# Patient Record
Sex: Female | Born: 1945 | Race: White | Hispanic: No | Marital: Married | Smoking: Former smoker
Health system: Southern US, Community
[De-identification: ages and names within clinical notes are randomized; demographics above are authoritative.]

## PROBLEM LIST (undated history)

## (undated) DIAGNOSIS — E039 Hypothyroidism, unspecified: Secondary | ICD-10-CM

## (undated) DIAGNOSIS — M1990 Unspecified osteoarthritis, unspecified site: Secondary | ICD-10-CM

## (undated) DIAGNOSIS — E785 Hyperlipidemia, unspecified: Secondary | ICD-10-CM

## (undated) DIAGNOSIS — R519 Headache, unspecified: Secondary | ICD-10-CM

## (undated) DIAGNOSIS — R001 Bradycardia, unspecified: Secondary | ICD-10-CM

## (undated) DIAGNOSIS — T7840XA Allergy, unspecified, initial encounter: Secondary | ICD-10-CM

## (undated) DIAGNOSIS — R51 Headache: Secondary | ICD-10-CM

## (undated) DIAGNOSIS — H539 Unspecified visual disturbance: Secondary | ICD-10-CM

## (undated) HISTORY — DX: Allergy, unspecified, initial encounter: T78.40XA

## (undated) HISTORY — DX: Bradycardia, unspecified: R00.1

## (undated) HISTORY — DX: Hyperlipidemia, unspecified: E78.5

## (undated) HISTORY — DX: Hypothyroidism, unspecified: E03.9

## (undated) HISTORY — PX: HIP ARTHROPLASTY: SHX981

## (undated) HISTORY — DX: Headache: R51

## (undated) HISTORY — DX: Unspecified visual disturbance: H53.9

## (undated) HISTORY — PX: NECK LIFT: SHX6573

## (undated) HISTORY — PX: TUBAL LIGATION: SHX77

## (undated) HISTORY — PX: DENTAL SURGERY: SHX609

## (undated) HISTORY — DX: Unspecified osteoarthritis, unspecified site: M19.90

## (undated) HISTORY — DX: Headache, unspecified: R51.9

## (undated) HISTORY — PX: OTHER SURGICAL HISTORY: SHX169

---

## 1998-09-02 ENCOUNTER — Other Ambulatory Visit: Admission: RE | Admit: 1998-09-02 | Discharge: 1998-09-02 | Payer: Self-pay | Admitting: Obstetrics and Gynecology

## 1999-10-20 ENCOUNTER — Other Ambulatory Visit: Admission: RE | Admit: 1999-10-20 | Discharge: 1999-10-20 | Payer: Self-pay | Admitting: Obstetrics and Gynecology

## 2003-12-24 ENCOUNTER — Encounter: Admission: RE | Admit: 2003-12-24 | Discharge: 2003-12-24 | Payer: Self-pay | Admitting: Family Medicine

## 2005-01-31 ENCOUNTER — Encounter: Admission: RE | Admit: 2005-01-31 | Discharge: 2005-01-31 | Payer: Self-pay | Admitting: Family Medicine

## 2006-05-09 ENCOUNTER — Ambulatory Visit: Payer: Self-pay | Admitting: Family Medicine

## 2006-05-15 ENCOUNTER — Other Ambulatory Visit: Admission: RE | Admit: 2006-05-15 | Discharge: 2006-05-15 | Payer: Self-pay | Admitting: Family Medicine

## 2006-05-15 ENCOUNTER — Ambulatory Visit: Payer: Self-pay | Admitting: Family Medicine

## 2006-05-25 ENCOUNTER — Ambulatory Visit: Payer: Self-pay | Admitting: Internal Medicine

## 2006-08-06 ENCOUNTER — Ambulatory Visit: Payer: Self-pay | Admitting: Family Medicine

## 2007-02-04 ENCOUNTER — Ambulatory Visit: Payer: Self-pay | Admitting: Family Medicine

## 2007-03-04 ENCOUNTER — Ambulatory Visit: Payer: Self-pay | Admitting: Family Medicine

## 2007-06-19 ENCOUNTER — Ambulatory Visit: Payer: Self-pay | Admitting: Family Medicine

## 2007-06-20 ENCOUNTER — Encounter: Admission: RE | Admit: 2007-06-20 | Discharge: 2007-06-20 | Payer: Self-pay | Admitting: Family Medicine

## 2007-06-25 ENCOUNTER — Encounter: Admission: RE | Admit: 2007-06-25 | Discharge: 2007-06-25 | Payer: Self-pay | Admitting: Family Medicine

## 2007-07-02 ENCOUNTER — Encounter (INDEPENDENT_AMBULATORY_CARE_PROVIDER_SITE_OTHER): Payer: Self-pay | Admitting: Interventional Radiology

## 2007-07-02 ENCOUNTER — Other Ambulatory Visit: Admission: RE | Admit: 2007-07-02 | Discharge: 2007-07-02 | Payer: Self-pay | Admitting: Interventional Radiology

## 2007-07-02 ENCOUNTER — Encounter: Admission: RE | Admit: 2007-07-02 | Discharge: 2007-07-02 | Payer: Self-pay | Admitting: Family Medicine

## 2007-10-14 ENCOUNTER — Ambulatory Visit: Payer: Self-pay | Admitting: Family Medicine

## 2007-11-12 ENCOUNTER — Ambulatory Visit: Payer: Self-pay | Admitting: Family Medicine

## 2007-12-24 ENCOUNTER — Encounter: Admission: RE | Admit: 2007-12-24 | Discharge: 2007-12-24 | Payer: Self-pay | Admitting: Endocrinology

## 2008-08-20 ENCOUNTER — Ambulatory Visit: Payer: Self-pay | Admitting: Family Medicine

## 2008-08-28 ENCOUNTER — Encounter: Admission: RE | Admit: 2008-08-28 | Discharge: 2008-08-28 | Payer: Self-pay | Admitting: Family Medicine

## 2009-09-08 ENCOUNTER — Other Ambulatory Visit: Admission: RE | Admit: 2009-09-08 | Discharge: 2009-09-08 | Payer: Self-pay | Admitting: Family Medicine

## 2009-09-08 ENCOUNTER — Encounter: Payer: Self-pay | Admitting: Family Medicine

## 2009-09-08 ENCOUNTER — Ambulatory Visit: Payer: Self-pay | Admitting: Family Medicine

## 2009-09-16 ENCOUNTER — Ambulatory Visit: Payer: Self-pay | Admitting: Gastroenterology

## 2009-11-01 ENCOUNTER — Ambulatory Visit: Payer: Self-pay | Admitting: Family Medicine

## 2009-11-24 ENCOUNTER — Ambulatory Visit: Payer: Self-pay | Admitting: Family Medicine

## 2010-01-31 LAB — HM COLONOSCOPY: HM Colonoscopy: NORMAL

## 2010-08-02 ENCOUNTER — Ambulatory Visit: Payer: Self-pay | Admitting: Family Medicine

## 2010-09-07 HISTORY — PX: UPPER GASTROINTESTINAL ENDOSCOPY: SHX188

## 2010-09-13 ENCOUNTER — Ambulatory Visit: Payer: Self-pay | Admitting: Family Medicine

## 2010-09-19 ENCOUNTER — Ambulatory Visit: Payer: Self-pay | Admitting: Family Medicine

## 2010-11-17 ENCOUNTER — Ambulatory Visit: Admit: 2010-11-17 | Payer: Self-pay | Admitting: Family Medicine

## 2010-11-21 ENCOUNTER — Ambulatory Visit
Admission: RE | Admit: 2010-11-21 | Discharge: 2010-11-21 | Payer: Self-pay | Source: Home / Self Care | Attending: Family Medicine | Admitting: Family Medicine

## 2010-11-30 ENCOUNTER — Encounter
Admission: RE | Admit: 2010-11-30 | Discharge: 2010-11-30 | Payer: Self-pay | Source: Home / Self Care | Attending: Family Medicine | Admitting: Family Medicine

## 2010-11-30 LAB — HM COLONOSCOPY: HM Colonoscopy: NORMAL

## 2010-12-03 ENCOUNTER — Encounter: Payer: Self-pay | Admitting: Family Medicine

## 2010-12-04 ENCOUNTER — Encounter: Payer: Self-pay | Admitting: Family Medicine

## 2011-01-12 ENCOUNTER — Institutional Professional Consult (permissible substitution) (INDEPENDENT_AMBULATORY_CARE_PROVIDER_SITE_OTHER): Payer: BC Managed Care – PPO | Admitting: Family Medicine

## 2011-01-12 DIAGNOSIS — E039 Hypothyroidism, unspecified: Secondary | ICD-10-CM

## 2011-01-12 DIAGNOSIS — Z79899 Other long term (current) drug therapy: Secondary | ICD-10-CM

## 2011-01-12 DIAGNOSIS — M25519 Pain in unspecified shoulder: Secondary | ICD-10-CM

## 2011-01-14 ENCOUNTER — Emergency Department (HOSPITAL_COMMUNITY): Payer: BC Managed Care – PPO

## 2011-01-14 ENCOUNTER — Inpatient Hospital Stay (HOSPITAL_COMMUNITY)
Admission: EM | Admit: 2011-01-14 | Discharge: 2011-01-21 | DRG: 731 | Disposition: A | Discharge status: Home Health | Payer: BC Managed Care – PPO | Attending: Orthopedic Surgery | Admitting: Orthopedic Surgery

## 2011-01-14 DIAGNOSIS — Y9367 Activity, basketball: Secondary | ICD-10-CM

## 2011-01-14 DIAGNOSIS — S72033A Displaced midcervical fracture of unspecified femur, initial encounter for closed fracture: Principal | ICD-10-CM | POA: Diagnosis present

## 2011-01-14 DIAGNOSIS — S32409A Unspecified fracture of unspecified acetabulum, initial encounter for closed fracture: Secondary | ICD-10-CM | POA: Diagnosis present

## 2011-01-14 DIAGNOSIS — E039 Hypothyroidism, unspecified: Secondary | ICD-10-CM | POA: Diagnosis present

## 2011-01-14 DIAGNOSIS — X500XXA Overexertion from strenuous movement or load, initial encounter: Secondary | ICD-10-CM | POA: Diagnosis present

## 2011-01-14 LAB — DIFFERENTIAL
Basophils Absolute: 0 10*3/uL (ref 0.0–0.1)
Lymphocytes Relative: 6 % — ABNORMAL LOW (ref 12–46)
Lymphs Abs: 0.7 10*3/uL (ref 0.7–4.0)
Monocytes Absolute: 0.3 10*3/uL (ref 0.1–1.0)
Monocytes Relative: 3 % (ref 3–12)
Neutro Abs: 9.4 10*3/uL — ABNORMAL HIGH (ref 1.7–7.7)

## 2011-01-14 LAB — CBC
HCT: 36.4 % (ref 36.0–46.0)
Hemoglobin: 12.5 g/dL (ref 12.0–15.0)
MCH: 31.3 pg (ref 26.0–34.0)
MCHC: 34.3 g/dL (ref 30.0–36.0)
MCV: 91.2 fL (ref 78.0–100.0)

## 2011-01-14 LAB — URINALYSIS, ROUTINE W REFLEX MICROSCOPIC
Bilirubin Urine: NEGATIVE
Specific Gravity, Urine: 1.014 (ref 1.005–1.030)
Urobilinogen, UA: 0.2 mg/dL (ref 0.0–1.0)
pH: 7 (ref 5.0–8.0)

## 2011-01-14 LAB — COMPREHENSIVE METABOLIC PANEL
AST: 22 U/L (ref 0–37)
Albumin: 4.2 g/dL (ref 3.5–5.2)
Chloride: 105 mEq/L (ref 96–112)
Creatinine, Ser: 1.05 mg/dL (ref 0.4–1.2)
GFR calc Af Amer: >60 mL/min (ref >60)
Total Bilirubin: 0.8 mg/dL (ref 0.3–1.2)

## 2011-01-14 LAB — APTT: aPTT: 24 seconds (ref 24–37)

## 2011-01-14 LAB — URINE MICROSCOPIC-ADD ON

## 2011-01-14 LAB — SAMPLE TO BLOOD BANK

## 2011-01-15 LAB — ABO/RH: ABO/RH(D): O NEG

## 2011-01-15 LAB — TYPE AND SCREEN
ABO/RH(D): O NEG
Antibody Screen: NEGATIVE

## 2011-01-16 LAB — BASIC METABOLIC PANEL
Calcium: 8.4 mg/dL (ref 8.4–10.5)
GFR calc Af Amer: >60 mL/min (ref >60)
GFR calc non Af Amer: >60 mL/min (ref >60)
Sodium: 139 mEq/L (ref 135–145)

## 2011-01-16 LAB — CBC
MCHC: 32.9 g/dL (ref 30.0–36.0)
RDW: 12.8 % (ref 11.5–15.5)

## 2011-01-18 ENCOUNTER — Inpatient Hospital Stay (HOSPITAL_COMMUNITY): Payer: BC Managed Care – PPO

## 2011-01-18 LAB — BASIC METABOLIC PANEL
CO2: 29 mEq/L (ref 19–32)
Calcium: 8.8 mg/dL (ref 8.4–10.5)
Chloride: 105 mEq/L (ref 96–112)
GFR calc Af Amer: >60 mL/min (ref >60)
Sodium: 139 mEq/L (ref 135–145)

## 2011-01-18 LAB — PROTIME-INR
INR: 1.01 (ref 0.00–1.49)
Prothrombin Time: 13.5 seconds (ref 11.6–15.2)

## 2011-01-18 LAB — CBC
Hemoglobin: 10.7 g/dL — ABNORMAL LOW (ref 12.0–15.0)
MCHC: 32.7 g/dL (ref 30.0–36.0)
Platelets: 172 10*3/uL (ref 150–400)
RBC: 3.53 MIL/uL — ABNORMAL LOW (ref 3.87–5.11)

## 2011-01-18 LAB — TYPE AND SCREEN: ABO/RH(D): O NEG

## 2011-01-19 ENCOUNTER — Other Ambulatory Visit: Payer: Self-pay | Admitting: Orthopedic Surgery

## 2011-01-19 LAB — BASIC METABOLIC PANEL
CO2: 30 mEq/L (ref 19–32)
Calcium: 8.3 mg/dL — ABNORMAL LOW (ref 8.4–10.5)
Chloride: 101 mEq/L (ref 96–112)
Glucose, Bld: 112 mg/dL — ABNORMAL HIGH (ref 70–99)
Sodium: 136 mEq/L (ref 135–145)

## 2011-01-19 LAB — CBC
MCHC: 32.6 g/dL (ref 30.0–36.0)
MCV: 93 fL (ref 78.0–100.0)
Platelets: 178 10*3/uL (ref 150–400)
RDW: 12.4 % (ref 11.5–15.5)
WBC: 6 10*3/uL (ref 4.0–10.5)

## 2011-01-19 NOTE — H&P (Signed)
Kristen Figueroa, Kristen Figueroa                  ACCOUNT NO.:  [id number]  MEDICAL RECORD NO.:  [id number]           PATIENT TYPE:  E  LOCATION:  WLED                         FACILITY:  [facility]  PHYSICIAN:  Marlowe Kays, M.D.  DATE OF BIRTH:  05/23/1946  DATE OF ADMISSION:  01/14/2011 DATE OF DISCHARGE:                             HISTORY & PHYSICAL   CHIEF COMPLAINT:  "Pain in my left hip."  HISTORY OF PRESENT ILLNESS:  This 65 year old female was playing half- court basketball today in [place].  She caught a ball and was twisting taking a leap and came down quite hard on her left lower extremity.  She had pain immediately into the left leg and hip area. She could not weightbear, could not ambulate on this extremity.  She has severe marked hip pain with any range of motion.  She is brought to the emergency room where CT scan and x-rays confirmed a displaced femoral head fracture as well as acetabular fracture. However, she desires to have Dr. Ollen Gross of our practice do the evaluation and surgical procedure if any and we certainly agree with that.  Meanwhile, she is unable to weightbear, ambulate, or get about for normal things at her home and was decided to bring her in the hospital, put her left lower extremity in traction for stability as well as pain control.  With her level of immobility, there is high incidence of pulmonary embolism, so we will protect her with daily aspirin as well as TED hose and perhaps pulse hose as well.  PAST MEDICAL HISTORY: 1. She has had tubal ligation. 2. She is hypothyroid.  FAMILY HISTORY:  Essentially negative.  SOCIAL HISTORY:  Occasional EtOH.  No tobacco products.  ALLERGIES:  She has no medical allergies.  CURRENT MEDICATIONS:  Synthroid and Dexilant.  REVIEW OF SYSTEMS:  CNS:  No seizures or paralysis, numbness, double vision.  RESPIRATORY:  No productive cough, no hemoptysis, no shortness of breath.  CARDIOVASCULAR:  No chest pain,  no angina or orthopnea. GASTROINTESTINAL:  No nausea, vomiting, melena, or bloody stool. GENITOURINARY:  No discharge, dysuria, or hematuria.  MUSCULOSKELETAL: Primarily in present illness.  PHYSICAL EXAMINATION:  GENERAL:  Cooperative, friendly, fully alert, but in considerable amount of pain and discomfort 65 year old white female, seen lying on emergency room stretcher and she is accompanied by her husband. VITAL SIGNS:  Blood pressure 124/62, pulse 70 regular, respirations 18 unlabored. HEENT:  Normocephalic, PERRLA, EOM intact.  Oropharynx is clear. CHEST:  Clear to auscultation.  No rhonchi.  No rales.  No wheezes. HEART:  Regular rate and rhythm.  No murmurs are heard. ABDOMEN:  Soft, nontender.  Liver and spleen not felt. GENITALIA/RECTAL/PELVIC/BREASTS:  Not done, not pertinent to present illness. EXTREMITIES:  Left lower extremity is held quite still.  We certainly did not do range of motion of the hip.  She has good motor to dorsiflexion, pulses are 2+, and sensory is intact to the left lower extremity.  ADMISSION DIAGNOSES: 1. Fractured left femoral head and acetabulum. 2. Hypothyroidism. 3. Reflux.  PLAN:  The patient will be placed at bedrest  with overhead frame and traction to the left lower extremity as well as prophylaxis for pulmonary embolism.     Dooley L. Cherlynn June.   ______________________________ Marlowe Kays, M.D.    DLU/MEDQ  D:  01/14/2011  T:  01/15/2011  Job:  [id number]  cc:   Marlowe Kays, M.D. Fax: [phone]  Electronically Signed by Marlowe Kays M.D. on 01/18/2011 12:59:28 PM

## 2011-01-20 LAB — CBC
HCT: 28.3 % — ABNORMAL LOW (ref 36.0–46.0)
Hemoglobin: 9.6 g/dL — ABNORMAL LOW (ref 12.0–15.0)
MCH: 31.1 pg (ref 26.0–34.0)
MCHC: 33.9 g/dL (ref 30.0–36.0)
MCV: 91.6 fL (ref 78.0–100.0)

## 2011-01-20 LAB — BASIC METABOLIC PANEL
BUN: 6 mg/dL (ref 6–23)
CO2: 27 mEq/L (ref 19–32)
Calcium: 7.9 mg/dL — ABNORMAL LOW (ref 8.4–10.5)
Creatinine, Ser: 0.84 mg/dL (ref 0.4–1.2)
GFR calc Af Amer: >60 mL/min (ref >60)
Glucose, Bld: 132 mg/dL — ABNORMAL HIGH (ref 70–99)

## 2011-01-21 LAB — CBC
HCT: 24.8 % — ABNORMAL LOW (ref 36.0–46.0)
Hemoglobin: 8.2 g/dL — ABNORMAL LOW (ref 12.0–15.0)
MCHC: 33.1 g/dL (ref 30.0–36.0)
MCV: 92.2 fL (ref 78.0–100.0)
RDW: 12.5 % (ref 11.5–15.5)

## 2011-01-21 LAB — BASIC METABOLIC PANEL
BUN: 6 mg/dL (ref 6–23)
CO2: 29 mEq/L (ref 19–32)
Chloride: 102 mEq/L (ref 96–112)
Creatinine, Ser: 0.84 mg/dL (ref 0.4–1.2)
Potassium: 3.5 mEq/L (ref 3.5–5.1)

## 2011-02-01 NOTE — Op Note (Signed)
NAMEJALAN, Figueroa                  ACCOUNT NO.:  [id number]  MEDICAL RECORD NO.:  [id number]           PATIENT TYPE:  I  LOCATION:  1612                         FACILITY:  [facility]  PHYSICIAN:  Ollen Gross, M.D.    DATE OF BIRTH:  April 28, 1946  DATE OF PROCEDURE: DATE OF DISCHARGE:                              OPERATIVE REPORT   PREOPERATIVE DIAGNOSIS:  Left femoral head/acetabular fracture.  POSTOPERATIVE DIAGNOSIS:  Left femoral head/acetabular fracture.  PROCEDURE:  Left total hip arthroplasty.  SURGEON:  Ollen Gross, M.D.  ASSISTANT:  Alexzandrew L. Perkins, P.A.C.  ANESTHESIA:  General.  ESTIMATED BLOOD LOSS:  200.  DRAINS:  Hemovac X 1.  COMPLICATIONS:  None.  CONDITION:  Stable to recovery.  BRIEF CLINICAL NOTE:  Ms. Kristen Figueroa is a 65 year old female who sustained a traumatic fracture, dislocation of her left hip on March 3.  She was playing basketball, twisted, fell and sheared off approximately 40% of her femoral head as well sustaining a posterior lip acetabular fracture. She subluxed the hip.  I was consulted on March 5 at the patient's request.  At this point, I felt that the head would be nonviable and that the most predictable means of restoring her function, eliminating her pain would be a total hip arthroplasty.  We did discuss treatment options in detail.  We discussed total hip in detail including procedures and potential complications and rehab course and she elected to proceed with total hip arthroplasty.  She presents today for a total hip arthroplasty.  PROCEDURE IN DETAIL:  After successful administration of general anesthetic, the patient is placed in the right lateral decubitus position with the left side up and held with the hip positioner.  Left lower extremity is isolated from her perineum with plastic drapes and prepped and draped in the usual sterile fashion.  Short posterolateral incision made with a 10 blade through subcutaneous tissue to  the level of fascia lata and was incised in line with the skin incision.  Sciatic nerve was palpated and protected.  The short rotators were capsulated, then isolated off the femur.  The hip was dislocated.  This revealed a shear injury of approximately 50% of the femoral head.  We sheared in a vertical fashion.  We marked where the center of the head would be and placed a trial prosthesis at this level.  We marked the osteotomy line on the femoral neck and the osteotomy was made with an oscillating saw. Femoral head is then removed.  We removed the fragment that was also sheared off.  Did not see any gross pathology on bone but it was sent for pathologic analysis to rule out any kind of lesion.  We then prepared the proximal femur.  Retractors were placed and the starter reamer was passed into the canal.  The canal was then thoroughly irrigated to remove fatty contents.  Axial reaming was then performed up to 13.5 mm, proximal reaming to an 18 F and the sleeve machined to a small.  18 F small trial sleeve was placed.  The femur was then retracted anteriorly to gain acetabular exposure. Acetabular  retractors were placed.  The acetabular fracture consisted of posterior lip fracture with a loss of approximately 15% to 20% of the posterior superior wall.  The posterior column was completely intact.  I felt that we could easily do a primary acetabular component with screw augmentation as opposed to having to do any kind of more advanced reconstruction.  We started reaming at 45 mm, coursing increments of 2 through 51 mm.  A pinnacle acetabular shell was then impacted in anatomic position with excellent purchase.  I then placed 2 additional dome screws, each of those had excellent purchase.  I was very pleased with the stability of the cup.  Apex hole eliminator was then placed and a 32 mm neutral +4 marathon liner placed.  We then placed a trial stem which was an 18 x 13 with a 36+8 neck,  matching native anteversion. 32+0 trial head is placed.  Hips reduced with outstanding stability. There is full extension, full external rotation, 70 degrees flexion, 40 degrees adduction, 90 degrees internal rotation and 90 degrees of flexion and 70 degrees of internal rotation.  By placing the left leg on top of the right, noted that the leg lengths were equal.  The hip was then dislocated again and trials removed.  Permanent 18 F small sleeve is placed and 18 x 13 stem, 36+8 neck matching native anteversion.  32+0 ceramic head is placed and the hips reduced through same stability parameters.  Wound was copiously irrigated with saline solution and short rotators and capsule reattached to the femur through drill holes with Ethibond suture.  Fascia lata was closed over Hemovac drain with interrupted #1 Vicryl, subcu closed with #1-0 and #2-0 Vicryl and subcuticular running 4-0 Monocryl.  Catheter for Marcaine pain pump is placed and the pump is initiated.  The Hemovac drain was hooked to suction.  Steri-Strips and bulky sterile dressing were then applied and she is placed into a knee immobilizer, awakened and transported to recovery in stable condition.     Ollen Gross, M.D.     FA/MEDQ  D:  01/18/2011  T:  01/19/2011  Job:  [id number]  Electronically Signed by Ollen Gross M.D. on 02/01/2011 08:17:46 AM

## 2011-02-24 NOTE — Discharge Summary (Signed)
Kristen Figueroa, Kristen Figueroa                  ACCOUNT NO.:  [id number]  MEDICAL RECORD NO.:  [id number]           PATIENT TYPE:  I  LOCATION:  1612                         FACILITY:  [facility]  PHYSICIAN:  Ollen Gross, M.D.    DATE OF BIRTH:  05/02/1946  DATE OF ADMISSION:  01/14/2011 DATE OF DISCHARGE:  01/21/2011                              DISCHARGE SUMMARY   ADMITTING DIAGNOSES: 1. Fractured left femoral head and acetabular fracture. 2. Hypothyroidism. 3. Reflux disease.  DISCHARGE DIAGNOSES: 1. Left femoral head with acetabular fracture status post left total     hip replacement arthroplasty. 2. Postoperative acute blood loss anemia. 3. Mild postoperative hyponatremia, improved. 4. Hypothyroidism. 5. Reflux disease.  PROCEDURE:  January 18, 2011, left total hip.  Surgeon, Ollen Gross, M.D. Assistant, Alexzandrew L. Perkins, P.A.C.  Anesthesia, general.  Blood loss, 200 cc.  CONSULTS:  None.  BRIEF HISTORY:  Ms. Helling is a 65 year old female who sustained a traumatic fracture back on March 3rd prior to admission.  She was playing basketball and twisted and fell and sheared off about 40% of her femoral head and also sustained a posterior left acetabular fracture. She was admitted by Dr. Marlowe Kays who was on-call and placed at bed rest and the family requested Dr. Lequita Halt for surgical intervention. She remained at bed rest until Dr. Lequita Halt came back into town.  He saw the patient on March 5th and she was preop for surgical intervention.  LABORATORY DATA:  CBC on admission:  Hemoglobin 12.5, hematocrit of 36.4, white cell count 10.3, platelets 191.  Postop hemoglobin down to 9.9.  Last noted hemoglobin and hematocrit 8.2 and 24.8.  PT/INR 13.6 and 1.02 with a PTT of 24.  Serial prothrombin times followed per Coumadin protocol.  Last noted PT/INR 23.4 and 2.06.  Chem panel on admission, all within normal limits.  Serial BMETs were followed. Sodium did drop from 139 to 134,  back up to 136.  Remaining Chem panel within normal limits.  Preop UA cloudy, trace hemoglobin, 0-2 red cells, a few epithelials.  Blood type O negative.  X-RAYS:  Chest x-ray on admission showed mild hyperinflation, no acute chest on January 14, 2011.  Left hip films on January 14, 2011, shows probable fracture of the left femoral head with some element of subluxation.  CT imaging of the left hip would be helpful.  CT scan of the left hip shows comminuted left femoral head fracture with posterior impaction along the acetabulum and resultant partial subluxation of the lateral fracture fragment, avulsion of the portion of the posterosuperior acetabular lip. Portable left hip on January 18, 2011, shows left total hip arthroplasty. No acute complicating features.  EKG dated January 14, 2011, normal sinus rhythm, normal EKG.  HOSPITAL COURSE:  The patient was admitted to [facility], admitted for the above issues by Dr. Simonne Come.  She was placed at bed rest on January 14, 2011.  She was seen on hospital day 2 by Dr. Darrelyn Hillock who was covering also for Dr. Lequita Halt over the weekend with question of whether she is going to need a total  hip versus a hemiarthroplasty.  She was placed in traction and allowed to be on bed rest, allowed her to eat regular diet over the weekend on March 3 and March 4.  She was seen on rounds on March 5 by Dr. Lequita Halt.  Arrangements had to be made for surgical intervention.  On March 6, she was preop for the above procedure for the next day.  She had been placed on Lovenox for DVT prophylaxis while in bed and we held that on the morning of March 7. She was made n.p.o. after breakfast and she was later taken to the operating room later that day on January 18, 2011, and underwent the above procedure without complication.  She tolerated the procedure well, later transferred back to the orthopedic floor, given p.o. and IV analgesic pain control following surgery.  Hemovac drain  placed at the time of surgery was pulled on postop day 1.  She was allowed to be weightbearing as tolerated and we started therapy at this point.  Hemoglobin was 9.9. Lytes looked good.  She did well with physical therapy and by day 2, she was already up walking short distances.  Sodium was down to 134, felt to be a dilutional component, but she was diuresing well.  Dressing changed, incision looked good.  Put on iron supplementation for the low hemoglobin of 9.6.  She continued to progress with physical therapy and by the following day, she was up walking, doing well, no complaints, and discharged home.  DISCHARGE PLAN: 1. The patient was discharged home on January 21, 2011. 2. Discharge diagnoses, please see above. 3. Discharge meds; Nu-Iron, Robaxin, Percocet, Coumadin.  Continue     alprazolam, dicyclomine, omeprazole, and Synthroid.  DIET:  Heart-healthy diet.  ACTIVITY:  Partial weightbearing 25-50%, hip precautions, total hip protocol.  FOLLOWUP:  2 weeks.  DISPOSITION:  Home.  CONDITION UPON DISCHARGE:  Improving.     Alexzandrew L. Julien Girt, P.A.C.   ______________________________ Ollen Gross, M.D.    ALP/MEDQ  D:  02/16/2011  T:  02/16/2011  Job:  [id number]  Electronically Signed by Patrica Duel P.A.C. on 02/20/2011 07:50:39 AM Electronically Signed by Ollen Gross M.D. on 02/24/2011 09:01:22 AM

## 2011-07-27 ENCOUNTER — Encounter: Payer: Self-pay | Admitting: Family Medicine

## 2011-08-02 ENCOUNTER — Telehealth: Payer: Self-pay | Admitting: Family Medicine

## 2011-08-02 MED ORDER — ALPRAZOLAM 0.25 MG PO TABS: 0.2500 mg | ORAL_TABLET | Freq: Three times a day (TID) | ORAL | Status: DC | PRN

## 2011-08-02 NOTE — Telephone Encounter (Signed)
Xanax renewed

## 2011-08-04 ENCOUNTER — Other Ambulatory Visit: Payer: Self-pay | Admitting: *Deleted

## 2011-08-04 NOTE — Telephone Encounter (Signed)
Called in alprazolam 0.25mg  take 1 tablet by mouth 3 times daily as needed for anxiety #30 with 1 refill to [facility] on [address].  CM,LPN

## 2011-11-09 ENCOUNTER — Telehealth: Payer: Self-pay | Admitting: Internal Medicine

## 2011-11-09 MED ORDER — ALPRAZOLAM 0.25 MG PO TABS: 0.2500 mg | ORAL_TABLET | Freq: Three times a day (TID) | ORAL | Status: DC | PRN

## 2011-11-09 NOTE — Telephone Encounter (Signed)
Renew her Xanax 

## 2011-11-09 NOTE — Telephone Encounter (Signed)
CALLED IN XANAX PER JCL 

## 2012-03-27 ENCOUNTER — Telehealth: Payer: Self-pay | Admitting: Internal Medicine

## 2012-03-28 ENCOUNTER — Other Ambulatory Visit: Payer: Self-pay

## 2012-03-28 MED ORDER — ALPRAZOLAM 0.25 MG PO TABS: 0.2500 mg | ORAL_TABLET | Freq: Three times a day (TID) | ORAL | Status: DC | PRN

## 2012-03-28 NOTE — Telephone Encounter (Signed)
Done by cheri °

## 2012-03-28 NOTE — Telephone Encounter (Signed)
Okay to renew

## 2012-03-28 NOTE — Telephone Encounter (Signed)
Xanax called in per jcl 

## 2012-05-09 ENCOUNTER — Ambulatory Visit (INDEPENDENT_AMBULATORY_CARE_PROVIDER_SITE_OTHER): Payer: BC Managed Care – PPO | Admitting: Family Medicine

## 2012-05-09 ENCOUNTER — Encounter: Payer: Self-pay | Admitting: Family Medicine

## 2012-05-09 VITALS — BP 100/60 | HR 78 | Wt 147.0 lb

## 2012-05-09 DIAGNOSIS — S139XXA Sprain of joints and ligaments of unspecified parts of neck, initial encounter: Secondary | ICD-10-CM

## 2012-05-09 DIAGNOSIS — S39012A Strain of muscle, fascia and tendon of lower back, initial encounter: Secondary | ICD-10-CM

## 2012-05-09 DIAGNOSIS — R319 Hematuria, unspecified: Secondary | ICD-10-CM

## 2012-05-09 DIAGNOSIS — IMO0002 Reserved for concepts with insufficient information to code with codable children: Secondary | ICD-10-CM

## 2012-05-09 DIAGNOSIS — N390 Urinary tract infection, site not specified: Secondary | ICD-10-CM

## 2012-05-09 DIAGNOSIS — S161XXA Strain of muscle, fascia and tendon at neck level, initial encounter: Secondary | ICD-10-CM

## 2012-05-09 LAB — POCT URINALYSIS DIPSTICK
Bilirubin, UA: NEGATIVE
Ketones, UA: NEGATIVE
Nitrite, UA: NEGATIVE
pH, UA: 5

## 2012-05-09 MED ORDER — SULFAMETHOXAZOLE-TRIMETHOPRIM 800-160 MG PO TABS: 1.0000 | ORAL_TABLET | Freq: Two times a day (BID) | ORAL | Status: AC

## 2012-05-09 NOTE — Patient Instructions (Addendum)
Heat for 20 minutes 3 times per day to the neck and back. Use Advil 4 tablets 3 times per day. If you're totally back to normal in 2 weeks you can call and cancel the appointment
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
## 2012-05-21 ENCOUNTER — Ambulatory Visit (INDEPENDENT_AMBULATORY_CARE_PROVIDER_SITE_OTHER): Payer: BC Managed Care – PPO | Admitting: Family Medicine

## 2012-05-21 ENCOUNTER — Encounter: Payer: Self-pay | Admitting: Family Medicine

## 2012-05-21 VITALS — BP 110/70 | Wt 144.0 lb

## 2012-05-21 DIAGNOSIS — M542 Cervicalgia: Secondary | ICD-10-CM

## 2012-05-21 DIAGNOSIS — Z87448 Personal history of other diseases of urinary system: Secondary | ICD-10-CM

## 2012-05-21 LAB — POCT URINALYSIS DIPSTICK
Ketones, UA: NEGATIVE
Protein, UA: NEGATIVE
Spec Grav, UA: <=1.005

## 2012-05-21 NOTE — Progress Notes (Signed)
  Subjective:    Patient ID: Kristen Figueroa, female    DOB: 1946-10-03, 66 y.o.   MRN: [id number]  HPI She is here for recheck on her recent UTI. She is having no symptoms. Previous urinalysis did show red cells. She also over the last 5 days has noted increased neck pain especially with rotation. No numbness tingling or weakness. She has not been doing her rehabilitation at home.   Review of Systems     Objective:   Physical Exam Alert and in no distress. Full motion of the neck. No tenderness palpation of the neck or upper back area. Normal motor, sensory and DTRs. Urine dipstick was negative.     Assessment & Plan:   1. History of blood in urine  POCT Urinalysis Dipstick  2. Neck pain     I again instructed her on proper neck care with heat, stretching, anti-inflammatory. She continues to have difficulty, I will send her to physical therapy. No further workup needed for her UTI.

## 2012-05-21 NOTE — Patient Instructions (Signed)
Go head and start doing the heat, stretching and pain reliever. If you're still having symptoms in several weeks, call me and I will refer you to physical therapy

## 2012-06-12 ENCOUNTER — Other Ambulatory Visit: Payer: Self-pay | Admitting: Family Medicine

## 2012-06-12 DIAGNOSIS — Z1231 Encounter for screening mammogram for malignant neoplasm of breast: Secondary | ICD-10-CM

## 2012-06-20 ENCOUNTER — Ambulatory Visit
Admission: RE | Admit: 2012-06-20 | Discharge: 2012-06-20 | Disposition: A | Discharge status: Home / Self Care | Payer: BC Managed Care – PPO | Source: Ambulatory Visit | Attending: Family Medicine | Admitting: Family Medicine

## 2012-06-20 DIAGNOSIS — Z1231 Encounter for screening mammogram for malignant neoplasm of breast: Secondary | ICD-10-CM

## 2012-06-24 ENCOUNTER — Other Ambulatory Visit: Payer: Self-pay | Admitting: Internal Medicine

## 2012-06-24 DIAGNOSIS — E041 Nontoxic single thyroid nodule: Secondary | ICD-10-CM

## 2012-07-19 ENCOUNTER — Telehealth: Payer: Self-pay | Admitting: Internal Medicine

## 2012-07-19 MED ORDER — ALPRAZOLAM 0.25 MG PO TABS: 0.2500 mg | ORAL_TABLET | Freq: Three times a day (TID) | ORAL | Status: DC | PRN

## 2012-07-19 NOTE — Telephone Encounter (Signed)
Okay to renew

## 2012-07-19 NOTE — Telephone Encounter (Signed)
Called med in 

## 2012-08-01 ENCOUNTER — Telehealth: Payer: Self-pay | Admitting: Internal Medicine

## 2012-08-01 NOTE — Telephone Encounter (Signed)
Done sl 

## 2012-08-06 ENCOUNTER — Ambulatory Visit
Admission: RE | Admit: 2012-08-06 | Discharge: 2012-08-06 | Disposition: A | Discharge status: Home / Self Care | Payer: Medicare Other | Source: Ambulatory Visit | Attending: Internal Medicine | Admitting: Internal Medicine

## 2012-08-06 DIAGNOSIS — E041 Nontoxic single thyroid nodule: Secondary | ICD-10-CM

## 2012-12-05 ENCOUNTER — Other Ambulatory Visit: Payer: Self-pay | Admitting: Family Medicine

## 2012-12-05 ENCOUNTER — Other Ambulatory Visit: Payer: Self-pay | Admitting: Internal Medicine

## 2012-12-05 DIAGNOSIS — E049 Nontoxic goiter, unspecified: Secondary | ICD-10-CM

## 2012-12-05 DIAGNOSIS — E041 Nontoxic single thyroid nodule: Secondary | ICD-10-CM

## 2012-12-05 NOTE — Telephone Encounter (Signed)
Called in med 

## 2012-12-05 NOTE — Telephone Encounter (Signed)
Renew

## 2012-12-05 NOTE — Telephone Encounter (Signed)
Okay to renew

## 2012-12-05 NOTE — Telephone Encounter (Signed)
Is this okay to fill? 

## 2013-01-16 ENCOUNTER — Telehealth: Payer: Self-pay | Admitting: Internal Medicine

## 2013-01-16 NOTE — Telephone Encounter (Signed)
Faxed medical records to [place], [address] [place], [place]

## 2013-02-24 ENCOUNTER — Ambulatory Visit
Admission: RE | Admit: 2013-02-24 | Discharge: 2013-02-24 | Disposition: A | Discharge status: Home / Self Care | Payer: Medicare Other | Source: Ambulatory Visit | Attending: Internal Medicine | Admitting: Internal Medicine

## 2013-02-24 DIAGNOSIS — E041 Nontoxic single thyroid nodule: Secondary | ICD-10-CM

## 2013-04-25 ENCOUNTER — Other Ambulatory Visit: Payer: Self-pay | Admitting: Family Medicine

## 2013-04-25 NOTE — Telephone Encounter (Signed)
Is this okay to refill? 

## 2013-04-26 NOTE — Telephone Encounter (Signed)
Okay to do this with a couple refills

## 2014-01-02 ENCOUNTER — Inpatient Hospital Stay: Admit: 2014-01-02 | Payer: MEDICARE

## 2014-01-02 DIAGNOSIS — M899 Disorder of bone, unspecified: Secondary | ICD-10-CM

## 2014-09-02 ENCOUNTER — Other Ambulatory Visit: Payer: Self-pay | Admitting: Internal Medicine

## 2014-09-02 DIAGNOSIS — R0981 Nasal congestion: Secondary | ICD-10-CM

## 2014-10-07 ENCOUNTER — Ambulatory Visit
Admission: RE | Admit: 2014-10-07 | Discharge: 2014-10-07 | Disposition: A | Discharge status: Home / Self Care | Payer: Medicare Other | Source: Ambulatory Visit | Attending: Internal Medicine | Admitting: Internal Medicine

## 2014-10-07 DIAGNOSIS — R0981 Nasal congestion: Secondary | ICD-10-CM

## 2015-09-06 ENCOUNTER — Other Ambulatory Visit: Payer: Self-pay

## 2015-09-06 DIAGNOSIS — Z1231 Encounter for screening mammogram for malignant neoplasm of breast: Secondary | ICD-10-CM

## 2015-09-09 ENCOUNTER — Ambulatory Visit
Admission: RE | Admit: 2015-09-09 | Discharge: 2015-09-09 | Disposition: A | Discharge status: Home / Self Care | Payer: Medicare Other | Source: Ambulatory Visit

## 2015-09-09 DIAGNOSIS — Z1231 Encounter for screening mammogram for malignant neoplasm of breast: Secondary | ICD-10-CM

## 2015-09-18 ENCOUNTER — Other Ambulatory Visit: Payer: Self-pay | Admitting: Endocrinology

## 2015-09-18 DIAGNOSIS — E041 Nontoxic single thyroid nodule: Secondary | ICD-10-CM

## 2015-09-28 ENCOUNTER — Inpatient Hospital Stay: Admission: RE | Admit: 2015-09-28 | Payer: Medicare Other | Source: Ambulatory Visit

## 2015-12-28 ENCOUNTER — Emergency Department: Admit: 2015-12-28 | Payer: MEDICARE

## 2015-12-28 ENCOUNTER — Observation Stay: Admit: 2015-12-29 | Payer: MEDICARE

## 2015-12-28 ENCOUNTER — Observation Stay: Admit: 2015-12-28 | Payer: MEDICARE

## 2015-12-28 ENCOUNTER — Observation Stay
Admission: EM | Admit: 2015-12-28 | Discharge: 2015-12-29 | Disposition: A | Discharge status: Home / Self Care | Payer: MEDICARE

## 2015-12-28 DIAGNOSIS — R42 Dizziness and giddiness: Principal | ICD-10-CM

## 2015-12-28 LAB — VENOUS BLOOD GAS, LINE/SYRINGE
%HBO2-Line Draw: 55.7 % (ref 40.0–70.0)
Base Excess-Line Draw: 5.6 mmol/L (ref <=3.0)
CO2 Content-Line Draw: 31 mmol/L (ref 25–29)
Carboxyhgb-Line Draw: 1 %
HCO3-Line Draw: 30 mmol/L (ref 24–28)
Methemoglobin-Line Draw: 0.7 % (ref 0.0–1.5)
PCO2-Line Draw: 43 mm Hg (ref 41–51)
PH-Line Draw: 7.45 (ref 7.32–7.42)
PO2-Line Draw: 29 mm Hg (ref 25–40)
Reduced Hemoglobin-Line Draw: 42.6 % (ref 0.0–5.0)

## 2015-12-28 LAB — URINALYSIS W/RFL TO MICROSCOPIC
Bilirubin, UA: NEGATIVE
Glucose, UA: NEGATIVE mg/dL
Ketones, UA: 5 mg/dL
Nitrite, UA: NEGATIVE
Protein, UA: NEGATIVE mg/dL
RBC, UA: 4 /HPF (ref 0–3)
Specific Gravity, UA: 1.014 (ref 1.005–1.035)
Squam Epithel, UA: 5 /HPF (ref 0–5)
Trans Epithel, UA: <1 /HPF (ref 0–5)
Urobilinogen, UA: <2 mg/dL (ref 0.2–1.9)
WBC, UA: 2 /HPF (ref 0–5)
pH, UA: 6 (ref 5.0–8.0)

## 2015-12-28 LAB — CBC
Hematocrit: 37.6 % (ref 35.0–45.0)
Hemoglobin: 12.5 g/dL (ref 11.7–15.5)
MCH: 27.5 pg (ref 27.0–33.0)
MCHC: 33.3 g/dL (ref 32.0–36.0)
MCV: 82.7 fL (ref 80.0–100.0)
MPV: 7.9 fL (ref 7.5–11.5)
Platelets: 278 10*3/uL (ref 140–400)
RBC: 4.55 10*6/uL (ref 3.80–5.10)
RDW: 15.9 % (ref 11.0–15.0)
WBC: 8.6 10*3/uL (ref 3.8–10.8)

## 2015-12-28 LAB — DIFFERENTIAL
Basophils Absolute: 17 /uL (ref 0–200)
Basophils Relative: 0.2 % (ref 0.0–1.0)
Eosinophils Absolute: 69 /uL (ref 15–500)
Eosinophils Relative: 0.8 % (ref 0.0–8.0)
Lymphocytes Absolute: 1230 /uL (ref 850–3900)
Lymphocytes Relative: 14.3 % (ref 15.0–45.0)
Monocytes Absolute: 378 /uL (ref 200–950)
Monocytes Relative: 4.4 % (ref 0.0–12.0)
Neutrophils Absolute: 6906 /uL (ref 1500–7800)
Neutrophils Relative: 80.3 % (ref 40.0–80.0)

## 2015-12-28 LAB — LACTIC ACID: Lactate: 1.1 mmol/L (ref 0.5–2.2)

## 2015-12-28 LAB — HEPATIC FUNCTION PANEL
ALT: 9 U/L (ref 7–52)
AST: 14 U/L (ref 13–39)
Albumin: 4 g/dL (ref 3.5–5.7)
Alkaline Phosphatase: 96 U/L (ref 36–125)
Bilirubin, Direct: 0.1 mg/dL (ref 0.0–0.4)
Bilirubin, Indirect: 0.3 mg/dL (ref 0.0–1.1)
Total Bilirubin: 0.4 mg/dL (ref 0.0–1.5)
Total Protein: 7.7 g/dL (ref 6.4–8.9)

## 2015-12-28 LAB — BASIC METABOLIC PANEL
Anion Gap: 8 mmol/L (ref 3–16)
BUN: 12 mg/dL (ref 7–25)
CO2: 29 mmol/L (ref 21–33)
Calcium: 9.8 mg/dL (ref 8.6–10.3)
Chloride: 103 mmol/L (ref 98–110)
Creatinine: 0.68 mg/dL (ref 0.60–1.30)
Glucose: 107 mg/dL (ref 70–100)
Osmolality, Calculated: 290 mOsm/kg (ref 278–305)
Potassium: 3.4 mmol/L (ref 3.5–5.3)
Sodium: 140 mmol/L (ref 133–146)
eGFR AA CKD-EPI: >90 See note.
eGFR NONAA CKD-EPI: 90 See note.

## 2015-12-28 LAB — LIPASE: Lipase: 24 U/L (ref 4–82)

## 2015-12-28 MED ORDER — 0.45% NaCl infusion
0.45 | INTRAVENOUS | Status: AC
Start: 2015-12-28 — End: 2015-12-29
  Administered 2015-12-28: 22:00:00 50 mL/h via INTRAVENOUS

## 2015-12-28 MED ORDER — sodium chloride 0.9 % 1,000 mL IV fluid
Freq: Once | INTRAVENOUS | Status: AC
Start: 2015-12-28 — End: 2015-12-28
  Administered 2015-12-28: 17:00:00 via INTRAVENOUS

## 2015-12-28 MED ORDER — sodium chloride 0.9 % flush 10 mL
INTRAMUSCULAR | Status: AC
Start: 2015-12-28 — End: 2015-12-29
  Administered 2015-12-28 – 2015-12-29 (×3): 10 mL via INTRAVENOUS

## 2015-12-28 MED ORDER — ondansetron (ZOFRAN) 4 mg/2 mL injection 4 mg
4 | Freq: Three times a day (TID) | INTRAMUSCULAR | Status: AC | PRN
Start: 2015-12-28 — End: 2015-12-29
  Administered 2015-12-28: 22:00:00 4 mg via INTRAVENOUS

## 2015-12-28 MED ORDER — heparin (porcine) injection 5,000 Units
5000 | Freq: Three times a day (TID) | INTRAMUSCULAR | Status: AC
Start: 2015-12-28 — End: 2015-12-29
  Administered 2015-12-28 – 2015-12-29 (×3): 5000 [IU] via SUBCUTANEOUS

## 2015-12-28 MED ORDER — ondansetron (ZOFRAN) 4 mg/2 mL injection 4 mg
4 | Freq: Once | INTRAMUSCULAR | Status: AC
Start: 2015-12-28 — End: 2015-12-28
  Administered 2015-12-28: 17:00:00 4 mg via INTRAVENOUS

## 2015-12-28 MED ORDER — magnesium hydroxide (MILK OF MAGNESIA) Susp 10 mL
2400 | Freq: Every day | ORAL | Status: AC | PRN
Start: 2015-12-28 — End: 2015-12-29

## 2015-12-28 MED ORDER — ondansetron (ZOFRAN) tablet 4 mg
4 | Freq: Three times a day (TID) | ORAL | Status: AC | PRN
Start: 2015-12-28 — End: 2015-12-29

## 2015-12-28 MED ORDER — acetaminophen (TYLENOL) tablet 650 mg
325 | ORAL | Status: AC | PRN
Start: 2015-12-28 — End: 2015-12-29
  Administered 2015-12-29 (×2): 650 mg via ORAL

## 2015-12-28 MED FILL — HEPARIN (PORCINE) 5,000 UNIT/ML INJECTION SOLUTION: 5000 5,000 unit/mL | INTRAMUSCULAR | Qty: 1

## 2015-12-28 MED FILL — ONDANSETRON HCL (PF) 4 MG/2 ML INJECTION SOLUTION: 4 4 mg/2 mL | INTRAMUSCULAR | Qty: 2

## 2015-12-28 MED FILL — SODIUM CHLORIDE 0.45 % INTRAVENOUS SOLUTION: 0.45 0.45 % | INTRAVENOUS | Qty: 1000

## 2015-12-28 MED FILL — SODIUM CHLORIDE 0.9 % INTRAVENOUS SOLUTION: INTRAVENOUS | Qty: 1000

## 2015-12-28 NOTE — Unmapped (Signed)
States nausea is relieved. Continues to C/O vertigo.

## 2015-12-28 NOTE — Unmapped (Signed)
ED Attending Attestation Note    Date of service:  12/28/2015    This patient was seen by the resident physician.  I have seen and examined the patient, agree with the workup, evaluation, management and diagnosis. The care plan has been discussed and I concur.  I have reviewed the ECG and concur with the resident's interpretation.    My assessment reveals a 70 y.o. female patient with dizziness lightheadedness when she stands, nausea and vomiting which is worse with position.  Does seem consistent with vertigo however patient is unable to walk without significant assistance and there is some concern for potential gait ataxia.  Patient will be admitted for observation and further workup of gait ataxia.

## 2015-12-28 NOTE — Unmapped (Signed)
Donna Stafford    Date of Service: 12/28/2015    Reason for Visit: Dizziness      Patient History     HPI:  70 y.o. female with a pmhx of Hypertension, thyroid disease, anxiety who presented to the ED with a chief complaint of dizziness.  Patient states for the last few days She has had this feeling that she states is dizziness when she tries to stand up and walk too long.  When I ask her to describe this, she states that she just feels very faint and like she can't see very well and has to hold onto things to walk around but this is only when she has been up and walking around 4 a few minutes before.  Denies the feeling of the room spinning.  Denies tunnel vision.  Patient is ultimately unable to describe if this is lightheadedness or vertigo.  She has never had these symptoms before.  She does endorse some mild pain and feeling of fullness in her left ear that has been going on for a few days.  Denies fevers.  She nausea's nausea and dry heaving as well as decreased by mouth intake.  After she had a few episodes of dry heaving she was scared to eat and has not eaten very much in the last few days.  She does feel like she has not dehydrated.  Denies chest pain, shortness of breath with her dizziness/lightheadedness.  Denies focal weakness. Patient denies actual syncope.    ROS: Please refer to HPI for additional pertinent positives and negatives. The patient specifically endorsed symptoms as above.  The patient specifically denied:    Fevers, sweats, chest pain, palpitations, syncope, seizures, dyspnea, cough, abdominal pain,  diarrhea, dysuria, frequency, discharge, rashes, headache, numbness, weakness, melena, or other complaints today     Medical Hx        Past Medical History   Diagnosis Date   ??? Thyroid disease        Past Surgical History   Procedure Laterality Date   ??? Cyst removal     ??? Tonsillectomy     ??? Tubal ligation         Donna Stafford  reports that she has never smoked. She does not  have any smokeless tobacco history on file. She reports that she drinks alcohol. She reports that she does not use illicit drugs.    Previous Medications    ASPIRIN-ACETAMINOPHEN-CAFFEINE (EXCEDRIN) 250-250-65 MG PER TABLET    Take 2 tablets by mouth as needed for Pain.    ATORVASTATIN (LIPITOR) 10 MG TABLET    Take 10 mg by mouth at bedtime.    CALCIUM CARBONATE-VITAMIN D3 (CALCIUM 600 + D,3,) 600 MG(1,500MG ) -200 UNIT TAB    Take 1 tablet by mouth daily.    FLUOXETINE (PROZAC) 20 MG CAPSULE    Take 20 mg by mouth daily.    IBUPROFEN-DIPHENHYDRAMINE CIT (ADVIL PM) 200-38 MG TAB    Take 1 tablet by mouth at bedtime as needed.    LEVOTHYROXINE (SYNTHROID, LEVOTHROID) 75 MCG TABLET    Take 75 mcg by mouth daily.    OMEPRAZOLE (PRILOSEC) 20 MG CAPSULE    Take 20 mg by mouth every morning before breakfast.       Allergies:   Allergies as of 12/28/2015   ??? (No Known Allergies)         Physical Exam     ED Triage Vitals   Vital Signs Group  Temp 12/28/15 1043 98.2 ??F (36.8 ??C)      Temp Source 12/28/15 1043 Oral      Heart Rate 12/28/15 1043 57      Heart Rate Source 12/28/15 1043 Monitor      Resp 12/28/15 1043 18      SpO2 12/28/15 1043 98 %      BP 12/28/15 1043 166/73 mmHg      BP Location 12/28/15 1043 Right arm      BP Method 12/28/15 1043 Automatic      Patient Position 12/28/15 1043 Lying   SpO2 12/28/15 1043 98 %   O2 Device 12/28/15 1043 None (Room air)       General: This is a wd/wn female in no acute distress who appears their stated age.     HEENT: NC/AT. PERRL. OP clear. MMM. L ear with small amount of fluid behind TM but no obvious signs of bulging or erythema    Neck: Supple. Trachea midline. No lymphadenopathy.     Pulmonary: Non labored breathing, no chest wall TTP, lungs CTAB without wheezing, crackles, rales.    Cardiac:  Bradycardic at 57 but RR -m/g/r.     Abdomen: S/NT/ND/+BS. No cva tenderness.     Musculoskeletal: WWP with no clubbing, cyanosis, or deformities noted. Baseline ROM, no TTP. No  edema.     Vascular: +2 radial and DP pulses.     Skin: Warm and dry with no lesions noted - no petechiae, no purpura, no rashes. Non icteric.    Neuro:  AAOx4.  CN 2-12 intact. Ataxic gait.  Sensation intact.  Moving all 4 extremities equally and spontaneously, strength grossly symmetric. Finger to nose normal.    Diagnostic Studies     Labs:   Please see electronic medical records for full details.     Radiology:   Please see electronic medical records for full details.      EKG:   none    Emergency Department Procedures     none    ED Course and MDM     Donna Stafford is a 70 y.o. female who presented to the emergency department with Dizziness  . The patient was evaluated by myself and the ED Attending Physician.     A full history and physical was performed, and appropriate labs and images were obtained as above. Nursing notes were reviewed.     Initial exam reveals a well-appearing female in no acute distress with normal vital signs, afebrile.     Physical exam was Relatively unremarkable with normal neurologic examination except for mildly ataxic gait.  Patient is asymptomatic while she is sitting down.  I did notice a small amount of fluid behind her left ear .  I do not think that this is consistent with infection as the TM does not appear to be bulging and I do not think the small amount of fluid would cause all of her symptoms of lightheadedness/vertigo.  I do strongly suspect neurologic etiology of the patient's symptoms.  She was grossly orthostatic but it sounds like this is due to dry heaving and decreased by mouth that started after her lightheadedness and vertiginous symptoms started and is potentially unrelated.  CT of the head reveals diffuse microvascular disease but no acute changes.  Her symptoms have been going on for several days so if this does represent a stroke she is well outside the window for TPA and I did not feel she warranted CTA as she will likely rather  just need MRI and MRA for full  evaluation.  The patient's labs were relatively unremarkable.  Urine without signs of infection.  Patient will be admitted to medicine for further workup and management of syncope/TIA/vertigo.    Patient was treated with below medications with some relief of symptoms :  2 L NS  zofran 4 mg iv.     Consults:  none    Impression     dizziness    Plan     At this time the patient has been admitted for further evaluation and management. The patient will continue to be monitored here in the Emergency Department until which time he is moved to his new treatment location.       Allena Napoleon, MD  PGY-3  [facility]  Emergency Department        Allena Napoleon, MD  Resident  12/28/15 [phone]

## 2015-12-28 NOTE — Unmapped (Signed)
Patient arrived to room 131 from ED. Alert and oriented x4. VSS. SR w/ IVCD on tele. No complaints of any pain or SOB. Does state she has dizziness when she moves but not at rest. PRN zofran given per patient request before eating. 1/2 NS running @ 41ml/hr. Patient oriented to room and call light. Updated on plan of care. Will continue to monitor.    Abelina Bachelor, RN

## 2015-12-28 NOTE — Unmapped (Signed)
Admission documentation completed.  Donna Stafford  [phone]

## 2015-12-28 NOTE — Unmapped (Signed)
Attempted to do orthostatics. When sitting up vomited copiously.Unable to stand due to vertigo.

## 2015-12-28 NOTE — Unmapped (Signed)
Medication Reconciliation  [place] [facility]    Patient Name: Donna Stafford 1946-08-18    Medication reconciliation has been completed by: Pharmacy Technician    Source(s) of information:  Patient and medication bottles     Primary Care Physician: Joseph Berkshire, MD    Pharmacy:   [facility] [address]  [address]  [facility] [place] [address]  Phone: [phone]       No Known Allergies    Prior to Admission medications taking for visit date 12/28/15   Medication Sig Taking? Authorizing Provider   aspirin-acetaminophen-caffeine (EXCEDRIN) 250-250-65 mg per tablet Take 2 tablets by mouth as needed for Pain. Yes Historical Provider, MD   atorvastatin (LIPITOR) 10 MG tablet Take 10 mg by mouth at bedtime. Yes Historical Provider, MD   calcium carbonate-vitamin D3 (CALCIUM 600 + D,3,) 600 mg(1,500mg ) -200 unit Tab Take 1 tablet by mouth daily. Yes Historical Provider, MD   FLUoxetine (PROZAC) 20 MG capsule Take 20 mg by mouth daily. Yes Historical Provider, MD   ibuprofen-diphenhydramine cit (ADVIL PM) 200-38 mg Tab Take 1 tablet by mouth at bedtime as needed. Yes Historical Provider, MD   levothyroxine (SYNTHROID, LEVOTHROID) 75 MCG tablet Take 75 mcg by mouth daily. Yes Historical Provider, MD   omeprazole (PRILOSEC) 20 MG capsule Take 20 mg by mouth every morning before breakfast. Yes Historical Provider, MD         Medications flagged for removal (include reason, ex. noncompliance, medication cost, therapy complete etc.):        Other notes (antibiotics, medication pumps, insulin, patches, cream/ointments, tapers, warfarin etc.):         To my knowledge the above medication reconciliation is accurate as of 12/28/2015 3:01 PM.        MARIA BENNETT, CPhT   12/28/2015 3:01 PM  [phone]

## 2015-12-28 NOTE — Unmapped (Signed)
Pt c/o vertigo s/s x 2 days, states dizziness worse with movement of head and sitting up. Also c/o left ear pain and nausea

## 2015-12-28 NOTE — Unmapped (Signed)
D: Patient resting peacefully in bed. C/O dizziness/vertigo when turns head to EITHER side or raises head of bed.   A: Full assessment complete, see flow sheet. Vital signs stable. Care plan reviewed. Vertigo resolves when at rest.  R: Patient verbalizes understanding of care plan. Advised to call for needs. Call light well within reach. Will continue to monitor.

## 2015-12-29 LAB — BASIC METABOLIC PANEL
Anion Gap: 8 mmol/L (ref 3–16)
BUN: 10 mg/dL (ref 7–25)
CO2: 26 mmol/L (ref 21–33)
Calcium: 9.2 mg/dL (ref 8.6–10.3)
Chloride: 105 mmol/L (ref 98–110)
Creatinine: 0.55 mg/dL — ABNORMAL LOW (ref 0.60–1.30)
Glucose: 93 mg/dL (ref 70–100)
Osmolality, Calculated: 287 mosm/kg (ref 278–305)
Potassium: 3.3 mmol/L — ABNORMAL LOW (ref 3.5–5.3)
Sodium: 139 mmol/L (ref 133–146)
eGFR AA CKD-EPI: >90 See note.
eGFR NONAA CKD-EPI: >90 See note.

## 2015-12-29 LAB — DIFFERENTIAL
Basophils Absolute: 44 /uL (ref 0–200)
Basophils Relative: 0.5 % (ref 0.0–1.0)
Eosinophils Absolute: 62 /uL (ref 15–500)
Eosinophils Relative: 0.7 % (ref 0.0–8.0)
Lymphocytes Absolute: 2279 /uL (ref 850–3900)
Lymphocytes Relative: 25.9 % (ref 15.0–45.0)
Monocytes Absolute: 414 /uL (ref 200–950)
Monocytes Relative: 4.7 % (ref 0.0–12.0)
Neutrophils Absolute: 6002 /uL (ref 1500–7800)
Neutrophils Relative: 68.2 % (ref 40.0–80.0)

## 2015-12-29 LAB — CBC
Hematocrit: 34.6 % — ABNORMAL LOW (ref 35.0–45.0)
Hemoglobin: 11.5 g/dL — ABNORMAL LOW (ref 11.7–15.5)
MCH: 27.4 pg (ref 27.0–33.0)
MCHC: 33.2 g/dL (ref 32.0–36.0)
MCV: 82.5 fL (ref 80.0–100.0)
MPV: 7.6 fL (ref 7.5–11.5)
Platelets: 258 10E3/uL (ref 140–400)
RBC: 4.19 10E6/uL (ref 3.80–5.10)
RDW: 15.7 % — ABNORMAL HIGH (ref 11.0–15.0)
WBC: 8.8 10E3/uL (ref 3.8–10.8)

## 2015-12-29 MED ORDER — meclizine (ANTIVERT) tablet 25 mg
25 | Freq: Once | ORAL | Status: AC
Start: 2015-12-29 — End: 2015-12-29
  Administered 2015-12-29: 05:00:00 25 mg via ORAL

## 2015-12-29 MED ORDER — meclizine (ANTIVERT) 25 mg tablet
25 | ORAL_TABLET | Freq: Three times a day (TID) | ORAL | Status: AC | PRN
Start: 2015-12-29 — End: 2019-11-29

## 2015-12-29 MED FILL — HEPARIN (PORCINE) 5,000 UNIT/ML INJECTION SOLUTION: 5000 5,000 unit/mL | INTRAMUSCULAR | Qty: 1

## 2015-12-29 MED FILL — TYLENOL 325 MG TABLET: 325 325 mg | ORAL | Qty: 2

## 2015-12-29 MED FILL — MECLIZINE 25 MG TABLET: 25 25 mg | ORAL | Qty: 1

## 2015-12-29 NOTE — Unmapped (Signed)
Discharge to private home via private car with family. Able to make needs known.

## 2015-12-29 NOTE — Unmapped (Addendum)
Vertigo  Vertigo means you feel like you or your surroundings are moving when they are not. Vertigo can be dangerous if it occurs when you are at work, driving, or performing difficult activities.   CAUSES   Vertigo occurs when there is a conflict of signals sent to your brain from the visual and sensory systems in your body. There are many different causes of vertigo, including:  ?? Infections, especially in the inner ear.  ?? A bad reaction to a drug or misuse of alcohol and medicines.  ?? Withdrawal from drugs or alcohol.  ?? Rapidly changing positions, such as lying down or rolling over in bed.  ?? A migraine headache.  ?? Decreased blood flow to the brain.  ?? Increased pressure in the brain from a head injury, infection, tumor, or bleeding.  SYMPTOMS   You may feel as though the world is spinning around or you are falling to the ground. Because your balance is upset, vertigo can cause nausea and vomiting. You may have involuntary eye movements (nystagmus).  DIAGNOSIS   Vertigo is usually diagnosed by physical exam. If the cause of your vertigo is unknown, your caregiver may perform imaging tests, such as an MRI scan (magnetic resonance imaging).  TREATMENT   Most cases of vertigo resolve on their own, without treatment. Depending on the cause, your caregiver may prescribe certain medicines. If your vertigo is related to body position issues, your caregiver may recommend movements or procedures to correct the problem. In rare cases, if your vertigo is caused by certain inner ear problems, you may need surgery.  HOME CARE INSTRUCTIONS   ?? Follow your caregiver's instructions.  ?? Avoid driving.  ?? Avoid operating heavy machinery.  ?? Avoid performing any tasks that would be dangerous to you or others during a vertigo episode.  ?? Tell your caregiver if you notice that certain medicines seem to be causing your vertigo. Some of the medicines used to treat vertigo episodes can actually make them worse in some people.  SEEK  IMMEDIATE MEDICAL CARE IF:   ?? Your medicines do not relieve your vertigo or are making it worse.  ?? You develop problems with talking, walking, weakness, or using your arms, hands, or legs.  ?? You develop severe headaches.  ?? Your nausea or vomiting continues or gets worse.  ?? You develop visual changes.  ?? A family member notices behavioral changes.  ?? Your condition gets worse.  MAKE SURE YOU:  ?? Understand these instructions.  ?? Will watch your condition.  ?? Will get help right away if you are not doing well or get worse.     This information is not intended to replace advice given to you by your health care provider. Make sure you discuss any questions you have with your health care provider.     Document Released: 08/09/2005 Document Revised: 01/22/2012 Document Reviewed: 02/22/2015  Elsevier Interactive Patient Education ??2016 Elsevier Inc.  Meclizine tablets or capsules  What is this medicine?  MECLIZINE (MEK li zeen) is an antihistamine. It is used to prevent nausea, vomiting, or dizziness caused by motion sickness. It is also used to prevent and treat vertigo (extreme dizziness or a feeling that you or your surroundings are tilting or spinning around).  This medicine may be used for other purposes; ask your health care provider or pharmacist if you have questions.  What should I tell my health care provider before I take this medicine?  They need to know if   you have any of these conditions:  -asthma  -glaucoma  -prostate trouble  -stomach problems  -urinary problems  -an unusual or allergic reaction to meclizine, other medicines, foods, dyes, or preservatives  -pregnant or trying to get pregnant  -breast-feeding  How should I use this medicine?  Take this medicine by mouth with a glass of water. Follow the directions on the prescription label. If you are using this medicine to prevent motion sickness, take the dose at least 1 hour before travel. If it upsets your stomach, take it with food or milk. Take  your doses at regular intervals. Do not take your medicine more often than directed.  Talk to your pediatrician regarding the use of this medicine in children. Special care may be needed.  Overdosage: If you think you have taken too much of this medicine contact a poison control center or emergency room at once.  NOTE: This medicine is only for you. Do not share this medicine with others.  What if I miss a dose?  If you miss a dose, take it as soon as you can. If it is almost time for your next dose, take only that dose. Do not take double or extra doses.  What may interact with this medicine?  -barbiturate medicines for inducing sleep or treating seizures  -digoxin  -medicines for anxiety or sleeping problems, like alprazolam, diazepam or temazepam  -medicines for hay fever and other allergies  -medicines for mental depression  -medicines for movement abnormalities as in Parkinson's disease, or for stomach problems  -medicines for pain  -medicines that relax muscles  This list may not describe all possible interactions. Give your health care provider a list of all the medicines, herbs, non-prescription drugs, or dietary supplements you use. Also tell them if you smoke, drink alcohol, or use illegal drugs. Some items may interact with your medicine.  What should I watch for while using this medicine?  If you are taking this medicine on a regular schedule, visit your doctor or health care professional for regular checks on your progress.  You may get dizzy, drowsy or have blurred vision. Do not drive, use machinery, or do anything that needs mental alertness until you know how this medicine affects you. Do not stand or sit up quickly, especially if you are an older patient. This reduces the risk of dizzy or fainting spells. Alcohol can increase possible dizziness. Avoid alcoholic drinks.  Your mouth may get dry. Chewing sugarless gum or sucking hard candy, and drinking plenty of water may help. Contact your doctor if  the problem does not go away or is severe.  This medicine may cause dry eyes and blurred vision. If you wear contact lenses you may feel some discomfort. Lubricating drops may help. See your eye doctor if the problem does not go away or is severe.  What side effects may I notice from receiving this medicine?  Side effects that you should report to your doctor or health care professional as soon as possible:  -fainting spells  -fast or irregular heartbeat  Side effects that usually do not require medical attention (report to your doctor or health care professional if they continue or are bothersome):  -constipation  -difficulty passing urine  -difficulty sleeping  -headache  -stomach upset  This list may not describe all possible side effects. Call your doctor for medical advice about side effects. You may report side effects to FDA at 1-800-FDA-1088.  Where should I keep my medicine?  Keep   out of the reach of children.  Store at room temperature between 15 and 30 degrees C (59 and 86 degrees F). Keep container tightly closed. Throw away any unused medicine after the expiration date.  NOTE: This sheet is a summary. It may not cover all possible information. If you have questions about this medicine, talk to your doctor, pharmacist, or health care provider.     ?? 2016, Elsevier/Gold Standard. (2008-05-07 10:35:36)

## 2015-12-29 NOTE — Unmapped (Signed)
12/29/15 0000   Vitals   Temp 98.1 ??F (36.7 ??C)   Temp Source Oral   Heart Rate 61   Heart Rate Source Monitor   Resp 16   BP 141/67 mmHg   MAP (mmHg) 84   BP Location Right arm   BP Method Automatic   Patient Position Lying   Cardiac Rhythm SR     Initial assessment completed. Pt alert and oriented x4. Denies discomfort at this time. One time dose of Antivert & given per order. Care plan reviewed.Tele monitoring in place. Vitals stable. Bed in low position with alarm on. Call light in reach. Will continue to monitor.

## 2015-12-29 NOTE — Unmapped (Signed)
[facility]  Case Management/Social Work Department  Discharge Planning Assessment  Patient Information   Current Mental Status: a/o x4  Patient lives with: spouse  Type of Home: one level  Number of Steps: 2 ste  Level of Activity Prior to Admission: independent           Current Level of Activity: same  DME Available at Home: none needed  PCP: Joseph Berkshire, MD  Home Pharmacy:          [facility] [address]  [address]  [facility] [place] [address]  Phone: [phone]             Issues related to obtaining prescriptions: none  Coumadin follow up: na  Transportation at discharge: spouse  Tobacco User: no  Lawyer:    Extended Emergency Contact Information  Primary Emergency Contact: [employer]  Address: [address]           [place], [place] [address] [place]  Home Phone: [phone]  Relation: Spouse     Permission to contact:  yes  Name of POA/Guardian: no       Verified: na  24/7 Supervision/Assistance Available at D/C if needed: yes from spouse  Barriers/Significant Issues that may affect discharge or follow up care: none  [employer] (Current/Prior): na  Home Health/Home Infusion (Current/Prior): na  Non-Skilled Services (Current/Prior): na  Oxygen/Bipap/CPAP/Hand Held Nebulizer Prior to admission: na  Outpatient Dialysis Services: na  Discharge Plan   Met with patient to initiate discussion regarding discharge planning. Introduced self and role of case management/social work and provided Tour manager.    Patient is returning home with spouse and denies discharge needs at this time.    CM/SW will continue to follow and remain available for continued discharge planning.    Patient/Family aware and taking part in the discharge plan.  Patient/family were offered a post-acute provider list as applicable to the discharge plan and insurance provider.  Patient/family were given the freedom to choose  providers and financial interest(s) were disclosed as appropriate.    Nettie Elm RN, CCM  Case Manager  [employer] [phone]

## 2015-12-29 NOTE — Unmapped (Signed)
[facility]   History and Physical    Patient: Donna Stafford  YQM:[id number]      Date of Birth: 1946/09/09  Age: 70 y.o.  Sex: female   PCP:  Joseph Berkshire, MD Room/Bed:   Location: [facility]       12/29/2015 9:40 AM    Chief complaint:       Chief Complaint   Patient presents with   ??? Dizziness       History of Present Illness:   Donna Stafford is a 70 y.o. female with past medical history of thyroid disease, hypertension and anxiety who presents with dizziness and nausea . Patient denies any weakness or difficulties with speech. No abdominal pain or diarrhea reported. Patient was admitted for dizziness to rule out CVA/ TIA.      REVIEW OF SYSTEMS:    Constitutional:  Negative for fever,chills or night sweats  ENT:  Negative for rhinorrhea, epistaxis, hoarseness, sore throat.  Respiratory:   Negative for shortness of breath,wheezing  Cardiovascular:   Negative for  chest pain, palpitations   Gastrointestinal:  Negative for nausea, vomiting, diarrhea  Genitourinary:  Negative for polyuria, dysuria   Hematologic/Lymphatic:  Negative for  bleeding tendency, easy bruising  Musculoskeletal:  Negative for myalgias,bone pain  Neurologic:  Negative for  confusion,dysarthria.  Skin:  Negative for itching,rash  Psychiatric:  Negative for depression,anxiety, agitation.  Endocrine:  Negative for polydipsia,polyuria,heat /cold intolerance.  Remainder of review of systems reviewed and negative.    Past Medical History:     Past Medical History   Diagnosis Date   ??? Thyroid disease          Past Surgical History:    has past surgical history that includes Cyst Removal; Tonsillectomy; and Tubal ligation.       Medications:     No current facility-administered medications on file prior to encounter.     No current outpatient prescriptions on file prior to encounter.         Allergies:   No Known Allergies       Social History:    reports that she quit smoking about 25 years ago. She has never used smokeless  tobacco. She reports that she drinks alcohol. She reports that she does not use illicit drugs.       Family History:   family history is not on file. ,       Physical Exam:   BP 154/76 mmHg   Pulse 60   Temp(Src) 97.9 ??F (36.6 ??C) (Oral)   Resp 16   Ht 5' 3 (1.6 m)   Wt 146 lb 12.8 oz (66.588 kg)   BMI 26.01 kg/m2   SpO2 96%    General appearance:  Appears comfortable. Well nourished  Eyes: Sclera clear, pupils equal  ENT: Moist mucus membranes, no thrush. Trachea midline.  Cardiovascular: Regular rhythm, normal S1, S2. No murmur, gallop, rub. No edema in lower extremities  Respiratory: Clear to auscultation bilaterally, no wheeze, good inspiratory effort  Gastrointestinal: Abdomen soft, non-tender, not distended, normal bowel sounds  Musculoskeletal: No cyanosis in digits, neck supple  Neurology: Cranial nerves grossly intact. Alert and oriented in time, place and person. No speech or motor deficits  Psychiatry: Appropriate affect. Not agitated  Skin: Warm, dry, normal turgor, no rash      Labs:   CBC:   Lab Results   Component Value Date    WBC 8.8 12/29/2015    RBC  4.19 12/29/2015    HGB 11.5* 12/29/2015    HCT 34.6* 12/29/2015    MCV 82.5 12/29/2015    MCH 27.4 12/29/2015    MCHC 33.2 12/29/2015    RDW 15.7* 12/29/2015    PLT 258 12/29/2015    MPV 7.6 12/29/2015     BMP:    Lab Results   Component Value Date    NA 139 12/29/2015    K 3.3* 12/29/2015    CL 105 12/29/2015    CO2 26 12/29/2015    BUN 10 12/29/2015    CREATININE 0.55* 12/29/2015    CALCIUM 9.2 12/29/2015    GLUCOSE 93 12/29/2015         Carotid doppler : Conclusions: Less than 50% stenosis bilateral ICA. Normal bilateral antegrade vertebral artery flow.     MRI:  1. No acute infarction, recent hemorrhage, or intracranial mass.  2. Mild chronic small vessel ischemic disease and cerebral atrophy.    CT head: Mild chronic microvascular ischemic white matter disease with no evidence of acute intracranial hemorrhage or mass effect.  ??  Small right  sphenoid mucous retention cyst.  Problem List     1.    Dizziness  2.    Rule out TIA (transient ischemic attack)/ CVA  3.    Hypertension  4.    Thyroid disease     Assessment/ Plan:        Patient was admitted for observation and plan to start on Asprin, statin and obtain MRI of brain.    Donna Stafford  12/29/2015 9:40 AM

## 2015-12-29 NOTE — Unmapped (Signed)
DISCHARGE SUMMARY  Patient ID: Donna Stafford   MRN: [id number] Date of birth: 1946-06-27    Admit date: 12/28/2015   Discharge date: 12/29/2015    Patient's primary care provider: Joseph Berkshire, MD    Discharge Diagnoses:   1.  ???? Dizziness  2. ???? Rule out TIA (transient ischemic attack)/ CVA  3. ???? Hypertension  4. ???? Thyroid disease  Past Medical History   has a past medical history of Thyroid disease.     Hospital Course:  Donna Stafford is a 70 y.o. female with past medical history of thyroid disease, hypertension and anxiety who presents with dizziness and nausea . Patient denies any weakness or difficulties with speech. No abdominal pain or diarrhea reported. Patient was admitted for dizziness to rule out CVA/ TIA.?? ??Patient was admitted for observation and plan to start on Asprin, statin and obtain MRI of brain.MRI was negative for any acute infarction.  Carotid Doppler shows less than 50% stenosis.  CT head was negative.  Patient ruled out for CVA/TIA.  She was discharged home in a stable condition.    Physical examination on discharge day:   BP 135/76 mmHg   Pulse 58   Temp(Src) 97.8 ??F (36.6 ??C) (Oral)   Resp 16   Ht 5' 3 (1.6 m)   Wt 146 lb 12.8 oz (66.588 kg)   BMI 26.01 kg/m2   SpO2 98%  General appearance:  Alert. Appears comfortable.  HEENT: Sclera clear. Moist mucus membranes.Trachea midline  Cardiovascular: Regular rate and rhythm, normal S1, S2. No murmur. No edema in lower extremities.  Respiratory: Clear to auscultation bilaterally, no wheeze, good inspiratory effort  GI: Abdomen soft, non-tender, not distended, normal bowel sounds  Neurology: CN 2-12 grossly intact  Skin: Warm, dry, normal turgor    Labs and Tests:  Carotid doppler : Conclusions: Less than 50% stenosis bilateral ICA. Normal bilateral antegrade vertebral artery flow.  ??  MRI:?? 1. No acute infarction, recent hemorrhage, or intracranial mass.  2. Mild chronic small vessel ischemic disease and cerebral atrophy.    CT head: Mild chronic  microvascular ischemic white matter disease with no evidence of acute intracranial hemorrhage or mass effect.  ??  Small right sphenoid mucous retention cyst.  Consults: ED CONTACT PROVIDER [facility]    Follow Up: Joseph Berkshire, MD in 1 wk   Disposition:  Home   Activity: No limitations on physical activity.    Diet:    Regular diet  Discharge Medications:   Lucillie, Kiesel   Home Medication Instructions [id number]    Printed on:03/07/16 2324   Medication Information                      aspirin-acetaminophen-caffeine (EXCEDRIN) 250-250-65 mg per tablet  Take 2 tablets by mouth as needed for Pain.             atorvastatin (LIPITOR) 10 MG tablet  Take 10 mg by mouth at bedtime.             calcium carbonate-vitamin D3 (CALCIUM 600 + D,3,) 600 mg(1,500mg ) -200 unit Tab  Take 1 tablet by mouth daily.             FLUoxetine (PROZAC) 20 MG capsule  Take 20 mg by mouth daily.             ibuprofen-diphenhydramine cit (ADVIL PM) 200-38 mg Tab  Take 1 tablet by mouth at bedtime as needed.  levothyroxine (SYNTHROID, LEVOTHROID) 75 MCG tablet  Take 75 mcg by mouth daily.             meclizine (ANTIVERT) 25 mg tablet  Take 1 tablet (25 mg total) by mouth 3 times a day as needed.             omeprazole (PRILOSEC) 20 MG capsule  Take 20 mg by mouth every morning before breakfast.                  Spent 35 minutes in discharge process    Thank you, Dr. Joseph Berkshire, MD, for allowing [place] to take care of the patient Donna Stafford . The patient needs to be followed by the primary care physician, Dr. Joseph Berkshire, MD.   Signed:  Edwina Barth     11:24 PM

## 2015-12-29 NOTE — Unmapped (Signed)
Alert and oriented x 4. Vital signs stable. Denies pain. Assessment complete, see flow sheet for details. POC discussed with pt. Call light in reach. Able to make needs known.

## 2015-12-29 NOTE — Unmapped (Signed)
Discharge instructions discussed with and provided in print to pt. Instructions include medication list with side effects and need for follow up appt. Pt verbalizes understanding.

## 2015-12-29 NOTE — Unmapped (Addendum)
Physical Therapy  Physical Therapy Initial Assessment/Tenative D/C     Name: Donna Stafford  DOB: [birth year]  Attending Physician: Maretta Bees, MD  Admission Diagnosis: Lightheadedness [R42]  Date: 12/29/2015  Precautions: Fall risk, act as tol  Reviewed Pertinent hospital course: Yes  Hospital Course PT/OT: 70 y/o female admitted with vertigo, dizziness, emesis, ataxic gait, MRI (-) for CVA  Assessment  Assessment: Impaired Ambulation, Impaired Balance, Impaired Strength, Deconditioning  Prognosis: Good   Patient to be possibly d/c'd home this date, if so, this note serves as a discharge summary, if not, please follow POC below.   Goals  Pt Will Transfer Bed/Chair: Independent  Pt Will Ambulate: Independent (with 500')  Miscellaneous Goal #1: Patient to participate in standing balance activities to improve overall functional mobility.   Time frame for goals to be met in: 5 days (01/03/16)   Recommendation  Plan  Treatment/Interventions: LE strengthening/ROM, Neuromuscular Reeducation, Patient/family training, Therapeutic Activity, Equipment eval/education, Therapeutic Exercise, Gait training, Continued evaluation  PT Frequency: 3-5x/wk    Recommendation  Recommendation: Outpatient PT (for vestibular rehabilitation if symptoms do not improve-gave information to patient to contact)  Problem List  Patient Active Problem List   Diagnosis   ??? TIA (transient ischemic attack)      Past Medical History  Past Medical History   Diagnosis Date   ??? Thyroid disease       Past Surgical History  Past Surgical History   Procedure Laterality Date   ??? Cyst removal     ??? Tonsillectomy     ??? Tubal ligation       Patient Stated Goals  Goal #1: To go home.    Home Living/Prior Function  Type of Home: House  Home Layout: One level;Performs ADL's on one level;Able to live on main level with bedroom/bathroom  Bathroom Shower/Tub: Medical sales representative: Standard  Level of Independence: Independent  Lives With: Spouse  ADL  Assistance: Independent  Homemaking/IADL Assistance: Independent  Vocational: Retired     Pain  Pain Score: 0-No pain    Vision  Current Vision: Wears glasses all the time    Cognition  Orientation Level: Oriented X4    Sensation  Light Touch: No apparent deficits  Sharp/Dull: No apparent deficits  Proprioception: No apparent deficits  Inattention/Neglect: Appears intact  Initiation: Appears intact  Motor Planning: Appears intact  Perseveration: Not present    Upper Extremity  RUE Assessment:  (Defer OT)     LUE Assessment:  (Defer OT)     Lower Extremity  RLE Assessment  RLE Assessment: Within Functional Limits  LLE Assessment  LLE Assessment: Within Functional Limits     Coordination  Heel to shin: Intact  Heel to toe: Intact  Finger to nose (standing): Intact     Functional Mobility  Bed Mobility Eval  Rolling: Independent  Supine to Sit: Independent  Sit to Supine:  (NT-Up in chair upon leaving with alarm in place)  Transfers Eval  Sit to Stand: Supervision  Bed to Chair: Supervision  Chair  to Bed: Supervision  Lateral Transfers: Supervision  Gait Eval  Pattern Eval :  (mild path deviation to the left, right with head turns, patient demonstrating, slow, careful cadence, d/t dizziness, reports much improved from yesterday, dizziness worsens with head turn to the left)  Gait Assistance Eval:  (SBA-Supervision)  Assistive Device Eval: None  Distance Eval: 250'  Balance Eval  Sitting - Static: Independent  Sitting-Dynamic: Independent   Standing-Static: Supervision  Standing-Dynamic: Supervision     Patient Education   Role of PT, POC, call for assist, safety, increasing activity, following up with OP PT if symptoms do not improve    Position After Physical Therapy session:   Up in chair  Call light and phone / communication device placed within patient's reach.    Handoff of Care:  Safety Handoff completed with RN, Tabitha after Rehabilitation session.    Time  Start Time: 1007  Stop Time: 1036  Time Calculation  (min): 29 min    Charges   $PT Evaluation Low Complex 20 Min: 1 Procedure     CPT codes   A. Personal factors and/or comorbidites that impact plan of care:  See above PMH / PSH / and problem list       B. An examination of body systems(s) musculoskeletal, neuromuscular, cardiovascular/pumonary, integumentary using standardized tests and measures:  Refer to above examination for details             C. A clinical presentation with unstable stable and/or evolving characteristics     D. Clinical decision making using standardized patient assessment instrument and/or measurable assessment of functional outcome                Low Complexity 97161      G Code   PT Mobility Walking/Moving Current Status with CI Modifier  PT Mobility Walking/Moving Goal Status with [facility] Modifier  PT Mobility Walking/Moving Discharge Status with CI Modifier   - evaluated with clinical judgement/functional assessment    Donna Stafford, PT, DPT # [id number]  12/29/2015 10:53 AM

## 2016-02-01 ENCOUNTER — Encounter: Payer: Self-pay | Admitting: Gastroenterology

## 2016-02-02 ENCOUNTER — Encounter: Payer: Self-pay | Admitting: Gastroenterology

## 2016-02-02 DIAGNOSIS — J329 Chronic sinusitis, unspecified: Secondary | ICD-10-CM | POA: Diagnosis not present

## 2016-03-10 DIAGNOSIS — M7632 Iliotibial band syndrome, left leg: Secondary | ICD-10-CM | POA: Diagnosis not present

## 2016-03-14 DIAGNOSIS — L821 Other seborrheic keratosis: Secondary | ICD-10-CM | POA: Diagnosis not present

## 2016-03-29 ENCOUNTER — Ambulatory Visit: Payer: Medicare Other | Admitting: Gastroenterology

## 2016-03-29 DIAGNOSIS — H5203 Hypermetropia, bilateral: Secondary | ICD-10-CM | POA: Diagnosis not present

## 2016-03-29 DIAGNOSIS — H52223 Regular astigmatism, bilateral: Secondary | ICD-10-CM | POA: Diagnosis not present

## 2016-03-29 DIAGNOSIS — H2513 Age-related nuclear cataract, bilateral: Secondary | ICD-10-CM | POA: Diagnosis not present

## 2016-04-03 DIAGNOSIS — R739 Hyperglycemia, unspecified: Secondary | ICD-10-CM | POA: Diagnosis not present

## 2016-04-03 DIAGNOSIS — E039 Hypothyroidism, unspecified: Secondary | ICD-10-CM | POA: Diagnosis not present

## 2016-04-03 DIAGNOSIS — E7800 Pure hypercholesterolemia, unspecified: Secondary | ICD-10-CM | POA: Diagnosis not present

## 2016-04-06 DIAGNOSIS — R739 Hyperglycemia, unspecified: Secondary | ICD-10-CM | POA: Diagnosis not present

## 2016-04-06 DIAGNOSIS — E039 Hypothyroidism, unspecified: Secondary | ICD-10-CM | POA: Diagnosis not present

## 2016-04-06 DIAGNOSIS — E010 Iodine-deficiency related diffuse (endemic) goiter: Secondary | ICD-10-CM | POA: Diagnosis not present

## 2016-04-06 DIAGNOSIS — E7800 Pure hypercholesterolemia, unspecified: Secondary | ICD-10-CM | POA: Diagnosis not present

## 2016-04-18 ENCOUNTER — Other Ambulatory Visit: Payer: Self-pay | Admitting: Internal Medicine

## 2016-04-18 DIAGNOSIS — E010 Iodine-deficiency related diffuse (endemic) goiter: Secondary | ICD-10-CM

## 2016-04-25 ENCOUNTER — Ambulatory Visit
Admission: RE | Admit: 2016-04-25 | Discharge: 2016-04-25 | Disposition: A | Discharge status: Home / Self Care | Payer: Medicare Other | Source: Ambulatory Visit | Attending: Internal Medicine | Admitting: Internal Medicine

## 2016-04-25 DIAGNOSIS — E042 Nontoxic multinodular goiter: Secondary | ICD-10-CM | POA: Diagnosis not present

## 2016-04-25 DIAGNOSIS — E010 Iodine-deficiency related diffuse (endemic) goiter: Secondary | ICD-10-CM

## 2016-05-25 DIAGNOSIS — N898 Other specified noninflammatory disorders of vagina: Secondary | ICD-10-CM | POA: Diagnosis not present

## 2016-05-25 DIAGNOSIS — Z113 Encounter for screening for infections with a predominantly sexual mode of transmission: Secondary | ICD-10-CM | POA: Diagnosis not present

## 2016-05-25 DIAGNOSIS — R309 Painful micturition, unspecified: Secondary | ICD-10-CM | POA: Diagnosis not present

## 2016-05-25 DIAGNOSIS — N390 Urinary tract infection, site not specified: Secondary | ICD-10-CM | POA: Diagnosis not present

## 2016-05-25 DIAGNOSIS — N3943 Post-void dribbling: Secondary | ICD-10-CM | POA: Diagnosis not present

## 2016-05-30 DIAGNOSIS — N393 Stress incontinence (female) (male): Secondary | ICD-10-CM | POA: Diagnosis not present

## 2016-05-30 DIAGNOSIS — N8189 Other female genital prolapse: Secondary | ICD-10-CM | POA: Diagnosis not present

## 2016-06-01 ENCOUNTER — Other Ambulatory Visit (HOSPITAL_COMMUNITY): Payer: Self-pay | Admitting: Obstetrics and Gynecology

## 2016-06-01 DIAGNOSIS — N898 Other specified noninflammatory disorders of vagina: Secondary | ICD-10-CM

## 2016-06-06 ENCOUNTER — Other Ambulatory Visit (INDEPENDENT_AMBULATORY_CARE_PROVIDER_SITE_OTHER): Payer: Medicare Other

## 2016-06-06 ENCOUNTER — Ambulatory Visit (INDEPENDENT_AMBULATORY_CARE_PROVIDER_SITE_OTHER): Payer: Medicare Other | Admitting: Gastroenterology

## 2016-06-06 ENCOUNTER — Encounter: Payer: Self-pay | Admitting: Gastroenterology

## 2016-06-06 VITALS — BP 100/60 | HR 64 | Ht 66.0 in | Wt 141.0 lb

## 2016-06-06 DIAGNOSIS — R140 Abdominal distension (gaseous): Secondary | ICD-10-CM | POA: Diagnosis not present

## 2016-06-06 LAB — SEDIMENTATION RATE: SED RATE: 1 mm/h (ref 0–30)

## 2016-06-06 LAB — IGA: IGA: 75 mg/dL (ref 68–378)

## 2016-06-06 NOTE — Patient Instructions (Addendum)
We will get records sent from your previous gastroenterologist for review (Dr. Janeice Robinson).  This will include any endoscopic (colonoscopy or upper endoscopy) procedures and any associated pathology reports.   Please start taking citrucel (orange flavored) powder fiber supplement.  This may cause some bloating at first but that usually goes away. Begin with a small spoonful and work your way up to a large, heaping spoonful daily over a week. You will have labs checked today in the basement lab.  Please head down after you check out with the front desk  (esr, tTG, total IgA). Try taking one gas ex pill before every meal, substitue for your dicyclomine before meals.

## 2016-06-06 NOTE — Progress Notes (Signed)
HPI: This is a  very pleasant 70 year old woman   who was referred to me by Dr. Pearson Grippe  to evaluate  bloating .    Chief complaint is chronic bloating  She says this is been a problem of hers for 10 years, worse lately.  Dr. Fraser Din GI at [facility].  She had hoaresness, laryngitis.  SHe was seen by ENT, was put on PPI and sent to GI doctor.  She had colonoscopy and EGD through him and she tells me these were all normal.  They 'did not see anything'.  She didn't really like Dr. Fraser Din.  She was on omeprazole twice daily.  Was also on dicyclomine once daily as well.  In the past month the bloating has returned. She is bothered by bloating constantly. She has been taking 3-4 dicyclomine daily. She feels she is somewhat better. She is avoiding sugar and bread.  She has abd discomfort with the bloating.  She had urinary bladder troubles recently.  She saw her gynecologist.  She is scheduled for a bladder scan. She says her urine was 'tested' and it was negative.  She takes omeprazole  in AM before breakfast meal usually.  No dysuria.    Overall her weight has been stable.  No dietary changes except she's cut out most sugar, potatoes, spaggetti, marina.  She has not been back to see Dr. Fraser Din (GI).  Pretty rare NSAIDs.  Only tylenol.  She has chronic constipation, since she was a teenager. Will take miralax or magnesium ever few days.  She's probably been more constipated lately.   Review of systems: Pertinent positive and negative review of systems were noted in the above HPI section. Complete review of systems was performed and was otherwise normal.   Past Medical History:  Diagnosis Date  . Allergy    RHINITIS  . Arthritis   . Dyslipidemia   . Hypothyroidism     Past Surgical History:  Procedure Laterality Date  . HIP ARTHROPLASTY Left   . TUBAL LIGATION    . UPPER GASTROINTESTINAL ENDOSCOPY  09/07/2010    Current Outpatient Prescriptions  Medication Sig  Dispense Refill  . ALPRAZolam (XANAX) 0.25 MG tablet TAKE AS DIRECTED 15 tablet 3  . Cholecalciferol (VITAMIN D) 2000 units tablet Take 6,000 Units by mouth daily.    Marland Kitchen dicyclomine (BENTYL) 10 MG capsule Take 1 capsule by mouth daily.  3  . levothyroxine (SYNTHROID, LEVOTHROID) 50 MCG tablet Take 50 mcg by mouth daily.    . Probiotic Product (ACIDOPHILUS/GOAT MILK) CAPS Take 1 tablet by mouth daily.     No current facility-administered medications for this visit.     Allergies as of 06/06/2016 - Review Complete 06/06/2016  Allergen Reaction Noted  . Codeine  09/16/2009    Family History  Problem Relation Age of Onset  . Bone cancer Mother     unknown origin mets to bones  . Thyroid disease Sister     number of family members, neice, cousin  . Diabetes Paternal Grandfather     Social History   Social History  . Marital status: Married    Spouse name: N/A  . Number of children: 2  . Years of education: N/A   Occupational History  . real Therapist, occupational    Social History Main Topics  . Smoking status: Former Games developer  . Smokeless tobacco: Never Used  . Alcohol use 2.4 oz/week    4 Glasses of wine per week  . Drug use: No  .  Sexual activity: Not on file   Other Topics Concern  . Not on file   Social History Narrative  . No narrative on file     Physical Exam: BP 100/60 (BP Location: Left Arm, Patient Position: Sitting, Cuff Size: Normal)   Pulse 64   Ht 5\' 6"  (1.676 m) Comment: height measured without shoes  Wt 141 lb (64 kg)   BMI 22.76 kg/m  Constitutional: generally well-appearing Psychiatric: alert and oriented x3 Eyes: extraocular movements intact Mouth: oral pharynx moist, no lesions Neck: supple no lymphadenopathy Cardiovascular: heart regular rate and rhythm Lungs: clear to auscultation bilaterally Abdomen: soft, nontender, nondistended, no obvious ascites, no peritoneal signs, normal bowel sounds Extremities: no lower extremity edema  bilaterally Skin: no lesions on visible extremities   Assessment and plan: 70 y.o. female with  Chronic bloating  She has had this problem for 10 years at least. It has been worse lately. She doesn't move her bowels very regularly, often struggles with constipation. She has never tried fiber supplements and instead relies on MiraLAX and magnesium on a when necessary basis. I recommended instead that she try fiber supplements on a daily basis. She is also going to start using a Gas-X pill with every meal instead of antispasm medicines. We are going to get some blood tests for her, see those summarized in patient instructions. Lastly we'll get her records sent here from her previous gastroenterologist in [place].   Rob Bunting, MD [facility] 06/06/2016, 10:45 AM  Cc: Ronnald Nian, MD

## 2016-06-07 LAB — TISSUE TRANSGLUTAMINASE, IGA: Tissue Transglutaminase Ab, IgA: 1 U/mL (ref <4)

## 2016-06-08 ENCOUNTER — Ambulatory Visit (HOSPITAL_COMMUNITY)
Admission: RE | Admit: 2016-06-08 | Discharge: 2016-06-08 | Disposition: A | Discharge status: Home / Self Care | Payer: Medicare Other | Source: Ambulatory Visit | Attending: Obstetrics and Gynecology | Admitting: Obstetrics and Gynecology

## 2016-06-08 DIAGNOSIS — N898 Other specified noninflammatory disorders of vagina: Secondary | ICD-10-CM

## 2016-06-08 DIAGNOSIS — R32 Unspecified urinary incontinence: Secondary | ICD-10-CM | POA: Diagnosis not present

## 2016-06-08 MED ORDER — IOTHALAMATE MEGLUMINE 17.2 % UR SOLN
250.0000 mL | Freq: Once | URETHRAL | Status: AC | PRN
Start: 2016-06-08 — End: 2016-06-08
  Administered 2016-06-08: 150 mL via INTRAVESICAL

## 2016-06-08 MED ORDER — IOTHALAMATE MEGLUMINE 17.2 % UR SOLN
250.0000 mL | Freq: Once | URETHRAL | Status: AC | PRN
  Administered 2016-06-08: 250 mL via INTRAVESICAL

## 2016-06-12 ENCOUNTER — Other Ambulatory Visit: Payer: Self-pay

## 2016-06-13 ENCOUNTER — Telehealth: Payer: Self-pay | Admitting: Gastroenterology

## 2016-06-13 NOTE — Telephone Encounter (Signed)
See result note.  

## 2016-06-21 ENCOUNTER — Telehealth: Payer: Self-pay | Admitting: Gastroenterology

## 2016-06-21 NOTE — Telephone Encounter (Signed)
EGD [facility], Dr. Gwenevere AbbotMixon, 08/2010: done for dyspepsia, suspected reflux; the examination was normal and biopsies of "regular z line" were normal. Colonoscopy Dr. Gwenevere AbbotMixon, 01/2010 was normal, done for screening.  She was recommended to have repeat colonoscopy at 10 year interval.     Please let her know I reviewed Dr. Duane LopeMixon's records.  She should continue the plan discussed in office recently.  Also put her in for recall colonoscopy for screening in 01/2020

## 2016-06-21 NOTE — Telephone Encounter (Signed)
advised

## 2016-08-02 DIAGNOSIS — N3942 Incontinence without sensory awareness: Secondary | ICD-10-CM | POA: Diagnosis not present

## 2016-08-02 DIAGNOSIS — R350 Frequency of micturition: Secondary | ICD-10-CM | POA: Diagnosis not present

## 2016-08-02 DIAGNOSIS — N3944 Nocturnal enuresis: Secondary | ICD-10-CM | POA: Diagnosis not present

## 2016-08-07 DIAGNOSIS — E7800 Pure hypercholesterolemia, unspecified: Secondary | ICD-10-CM | POA: Diagnosis not present

## 2016-08-07 DIAGNOSIS — E039 Hypothyroidism, unspecified: Secondary | ICD-10-CM | POA: Diagnosis not present

## 2016-08-07 DIAGNOSIS — R739 Hyperglycemia, unspecified: Secondary | ICD-10-CM | POA: Diagnosis not present

## 2016-08-10 DIAGNOSIS — R1031 Right lower quadrant pain: Secondary | ICD-10-CM | POA: Diagnosis not present

## 2016-08-10 DIAGNOSIS — Z23 Encounter for immunization: Secondary | ICD-10-CM | POA: Diagnosis not present

## 2016-08-11 ENCOUNTER — Other Ambulatory Visit: Payer: Self-pay | Admitting: Internal Medicine

## 2016-08-11 DIAGNOSIS — R1031 Right lower quadrant pain: Principal | ICD-10-CM

## 2016-08-11 DIAGNOSIS — G8929 Other chronic pain: Secondary | ICD-10-CM

## 2016-08-17 ENCOUNTER — Ambulatory Visit
Admission: RE | Admit: 2016-08-17 | Discharge: 2016-08-17 | Disposition: A | Discharge status: Home / Self Care | Payer: Medicare Other | Source: Ambulatory Visit | Attending: Internal Medicine | Admitting: Internal Medicine

## 2016-08-17 DIAGNOSIS — R1031 Right lower quadrant pain: Secondary | ICD-10-CM | POA: Diagnosis not present

## 2016-08-17 DIAGNOSIS — G8929 Other chronic pain: Secondary | ICD-10-CM

## 2016-08-17 MED ORDER — IOHEXOL 300 MG/ML  SOLN
30.0000 mL | Freq: Once | INTRAMUSCULAR | Status: AC | PRN
  Administered 2016-08-17: 30 mL via ORAL

## 2016-08-17 MED ORDER — IOPAMIDOL (ISOVUE-300) INJECTION 61%
100.0000 mL | Freq: Once | INTRAVENOUS | Status: AC | PRN
  Administered 2016-08-17: 100 mL via INTRAVENOUS

## 2016-08-22 DIAGNOSIS — N3944 Nocturnal enuresis: Secondary | ICD-10-CM | POA: Diagnosis not present

## 2016-08-22 DIAGNOSIS — N3942 Incontinence without sensory awareness: Secondary | ICD-10-CM | POA: Diagnosis not present

## 2016-08-30 DIAGNOSIS — N3942 Incontinence without sensory awareness: Secondary | ICD-10-CM | POA: Diagnosis not present

## 2016-08-30 DIAGNOSIS — N3944 Nocturnal enuresis: Secondary | ICD-10-CM | POA: Diagnosis not present

## 2016-09-26 DIAGNOSIS — E559 Vitamin D deficiency, unspecified: Secondary | ICD-10-CM | POA: Diagnosis not present

## 2016-09-26 DIAGNOSIS — E039 Hypothyroidism, unspecified: Secondary | ICD-10-CM | POA: Diagnosis not present

## 2016-09-26 DIAGNOSIS — E7800 Pure hypercholesterolemia, unspecified: Secondary | ICD-10-CM | POA: Diagnosis not present

## 2016-09-26 DIAGNOSIS — R109 Unspecified abdominal pain: Secondary | ICD-10-CM | POA: Diagnosis not present

## 2016-10-09 DIAGNOSIS — L821 Other seborrheic keratosis: Secondary | ICD-10-CM | POA: Diagnosis not present

## 2016-10-12 DIAGNOSIS — J040 Acute laryngitis: Secondary | ICD-10-CM | POA: Diagnosis not present

## 2016-12-05 DIAGNOSIS — J329 Chronic sinusitis, unspecified: Secondary | ICD-10-CM | POA: Diagnosis not present

## 2016-12-26 ENCOUNTER — Inpatient Hospital Stay: Admit: 2016-12-26 | Payer: MEDICARE

## 2016-12-26 DIAGNOSIS — Z13820 Encounter for screening for osteoporosis: Secondary | ICD-10-CM

## 2017-03-08 DIAGNOSIS — L821 Other seborrheic keratosis: Secondary | ICD-10-CM | POA: Diagnosis not present

## 2017-03-08 DIAGNOSIS — L820 Inflamed seborrheic keratosis: Secondary | ICD-10-CM | POA: Diagnosis not present

## 2017-04-24 ENCOUNTER — Other Ambulatory Visit: Payer: Self-pay | Admitting: Internal Medicine

## 2017-04-24 DIAGNOSIS — Z1231 Encounter for screening mammogram for malignant neoplasm of breast: Secondary | ICD-10-CM

## 2017-05-08 DIAGNOSIS — H52223 Regular astigmatism, bilateral: Secondary | ICD-10-CM | POA: Diagnosis not present

## 2017-05-08 DIAGNOSIS — H5203 Hypermetropia, bilateral: Secondary | ICD-10-CM | POA: Diagnosis not present

## 2017-05-08 DIAGNOSIS — H25813 Combined forms of age-related cataract, bilateral: Secondary | ICD-10-CM | POA: Diagnosis not present

## 2017-05-09 ENCOUNTER — Ambulatory Visit: Payer: Medicare Other

## 2017-05-14 DIAGNOSIS — E7800 Pure hypercholesterolemia, unspecified: Secondary | ICD-10-CM | POA: Diagnosis not present

## 2017-05-14 DIAGNOSIS — E559 Vitamin D deficiency, unspecified: Secondary | ICD-10-CM | POA: Diagnosis not present

## 2017-05-14 DIAGNOSIS — R109 Unspecified abdominal pain: Secondary | ICD-10-CM | POA: Diagnosis not present

## 2017-05-14 DIAGNOSIS — E039 Hypothyroidism, unspecified: Secondary | ICD-10-CM | POA: Diagnosis not present

## 2017-05-21 DIAGNOSIS — Z0000 Encounter for general adult medical examination without abnormal findings: Secondary | ICD-10-CM | POA: Diagnosis not present

## 2017-05-21 DIAGNOSIS — E039 Hypothyroidism, unspecified: Secondary | ICD-10-CM | POA: Diagnosis not present

## 2017-05-23 ENCOUNTER — Ambulatory Visit
Admission: RE | Admit: 2017-05-23 | Discharge: 2017-05-23 | Disposition: A | Discharge status: Home / Self Care | Payer: Medicare Other | Source: Ambulatory Visit | Attending: Internal Medicine | Admitting: Internal Medicine

## 2017-05-23 DIAGNOSIS — Z1231 Encounter for screening mammogram for malignant neoplasm of breast: Secondary | ICD-10-CM | POA: Diagnosis not present

## 2017-06-29 DIAGNOSIS — M5442 Lumbago with sciatica, left side: Secondary | ICD-10-CM | POA: Diagnosis not present

## 2017-06-29 DIAGNOSIS — M5441 Lumbago with sciatica, right side: Secondary | ICD-10-CM | POA: Diagnosis not present

## 2017-12-18 DIAGNOSIS — Z0000 Encounter for general adult medical examination without abnormal findings: Secondary | ICD-10-CM | POA: Diagnosis not present

## 2017-12-18 DIAGNOSIS — J0100 Acute maxillary sinusitis, unspecified: Secondary | ICD-10-CM | POA: Diagnosis not present

## 2018-01-04 DIAGNOSIS — I10 Essential (primary) hypertension: Secondary | ICD-10-CM | POA: Diagnosis not present

## 2018-01-04 DIAGNOSIS — R42 Dizziness and giddiness: Secondary | ICD-10-CM | POA: Diagnosis not present

## 2018-01-04 DIAGNOSIS — R51 Headache: Secondary | ICD-10-CM | POA: Diagnosis not present

## 2018-01-05 ENCOUNTER — Other Ambulatory Visit: Payer: Self-pay | Admitting: Internal Medicine

## 2018-01-05 DIAGNOSIS — R42 Dizziness and giddiness: Secondary | ICD-10-CM

## 2018-01-05 DIAGNOSIS — R519 Headache, unspecified: Secondary | ICD-10-CM

## 2018-01-05 DIAGNOSIS — R51 Headache: Secondary | ICD-10-CM

## 2018-01-07 DIAGNOSIS — R300 Dysuria: Secondary | ICD-10-CM | POA: Diagnosis not present

## 2018-01-07 DIAGNOSIS — N390 Urinary tract infection, site not specified: Secondary | ICD-10-CM | POA: Diagnosis not present

## 2018-01-09 ENCOUNTER — Ambulatory Visit
Admission: RE | Admit: 2018-01-09 | Discharge: 2018-01-09 | Disposition: A | Discharge status: Home / Self Care | Payer: Medicare Other | Source: Ambulatory Visit | Attending: Internal Medicine | Admitting: Internal Medicine

## 2018-01-09 DIAGNOSIS — R51 Headache: Secondary | ICD-10-CM

## 2018-01-09 DIAGNOSIS — R42 Dizziness and giddiness: Secondary | ICD-10-CM

## 2018-01-09 DIAGNOSIS — R519 Headache, unspecified: Secondary | ICD-10-CM

## 2018-01-17 DIAGNOSIS — R42 Dizziness and giddiness: Secondary | ICD-10-CM | POA: Diagnosis not present

## 2018-01-17 DIAGNOSIS — Z0189 Encounter for other specified special examinations: Secondary | ICD-10-CM | POA: Diagnosis not present

## 2018-02-12 DIAGNOSIS — J329 Chronic sinusitis, unspecified: Secondary | ICD-10-CM | POA: Diagnosis not present

## 2018-02-12 DIAGNOSIS — E039 Hypothyroidism, unspecified: Secondary | ICD-10-CM | POA: Diagnosis not present

## 2018-02-12 DIAGNOSIS — R42 Dizziness and giddiness: Secondary | ICD-10-CM | POA: Diagnosis not present

## 2018-02-12 DIAGNOSIS — H8110 Benign paroxysmal vertigo, unspecified ear: Secondary | ICD-10-CM | POA: Diagnosis not present

## 2018-02-25 ENCOUNTER — Ambulatory Visit: Payer: Medicare Other | Attending: Internal Medicine | Admitting: Physical Therapy

## 2018-02-25 DIAGNOSIS — R42 Dizziness and giddiness: Secondary | ICD-10-CM | POA: Insufficient documentation

## 2018-02-26 ENCOUNTER — Other Ambulatory Visit: Payer: Self-pay

## 2018-02-26 ENCOUNTER — Encounter: Payer: Self-pay | Admitting: Physical Therapy

## 2018-02-26 NOTE — Therapy (Signed)
[facility] [address] [place], [place], [address] Phone: [phone]   Fax:  [phone]  Physical Therapy Evaluation  Patient Details  Name: Kristen Figueroa MRN: [id number] Date of Birth: 08/07/1946 Referring Provider: Dr. Pearson GrippeJames Kim   Encounter Date: 02/25/2018  PT End of Session - 02/26/18 2058    Visit Number  1    Number of Visits  1    Date for PT Re-Evaluation  03/27/18    Authorization Type  [employer]    Authorization Time Period  02-25-18 - 04-27-18    PT Start Time  1447    PT Stop Time  1530    PT Time Calculation (min)  43 min       Past Medical History:  Diagnosis Date  . Allergy    RHINITIS  . Arthritis   . Dyslipidemia   . Hypothyroidism     Past Surgical History:  Procedure Laterality Date  . HIP ARTHROPLASTY Left   . TUBAL LIGATION    . UPPER GASTROINTESTINAL ENDOSCOPY  09/07/2010    There were no vitals filed for this visit.   Subjective Assessment - 02/26/18 2049    Subjective  Pt reports vertigo started around 01-02-18; had 1 previous episode in early Jan. and she attributed it to food poisoning; had MRI done - results negative; pt states she does not feel normal at this time; says she will get a hot flash and that will make her dizzy     Patient Stated Goals  confirm what is wrong     Currently in Pain?  No/denies         [facility] PT Assessment - 02/26/18 0001      Assessment   Medical Diagnosis  Vertigo    Referring Provider  Dr. Pearson GrippeJames Kim    Onset Date/Surgical Date  01/02/18      Precautions   Precautions  None      Balance Screen   Has the patient fallen in the past 6 months  No    Has the patient had a decrease in activity level because of a fear of falling?   No    Is the patient reluctant to leave their home because of a fear of falling?   No      Prior Function   Level of Independence  Independent    Vocation Requirements  pt sells real estate            Vestibular Assessment - 02/26/18 0001      Symptom Behavior   Type of Dizziness  "Funny feeling in head"    Frequency of Dizziness  pt reports feeling lack of energy every day    Duration of Dizziness  varies    Aggravating Factors  Sitting with head tilted back    Relieving Factors  Head stationary      Occulomotor Exam   Occulomotor Alignment  Normal    Spontaneous  Absent    Smooth Pursuits  Intact      Positional Testing   Dix-Hallpike  Dix-Hallpike Right;Dix-Hallpike Left    Sidelying Test  Sidelying Right;Sidelying Left      Dix-Hallpike Right   Dix-Hallpike Right Duration  none    Dix-Hallpike Right Symptoms  No nystagmus      Dix-Hallpike Left   Dix-Hallpike Left Duration  none    Dix-Hallpike Left Symptoms  No nystagmus      Sidelying Right   Sidelying Right Duration  none    Sidelying Right Symptoms  No nystagmus      Sidelying Left   Sidelying Left Duration  none    Sidelying Left Symptoms  No nystagmus          Objective measurements completed on examination: See above findings.              PT Education - 02/26/18 2056    Education provided  Yes    Education Details  etiology of BPPV explained;  Epley maneuver explained to pt but pt does not have signs or symptoms of BPPV at this time    Person(s) Educated  Patient    Methods  Explanation;Demonstration;Handout    Comprehension  Verbalized understanding          PT Long Term Goals - 02/26/18 2107      PT LONG TERM GOAL #1   Title  LTG's to be established if pt returns for follow up should vertigo re-occur - no vertigo reported at this time       Informed pt that she would be placed on hold x 30 days - to schedule appt ASAP should vertigo re-occur      Plan - 02/26/18 2100    Clinical Impression Statement  Pt does not have signs or symptoms of BPPV at this time; symptoms appear to have been consistent with episode of BPPV which has resolved as of this time.  No  nystagmus noted with any positional testing and no abnormaliites noted with any oculomotor testing.      Clinical Presentation  Stable    Clinical Presentation due to:  BPPV? which has resolved - no c/o vertigo at this time    Clinical Decision Making  Low    PT Frequency  One time visit eval only at this time due to no c/o vertigo    PT Treatment/Interventions  ADLs/Self Care Home Management;Canalith Repostioning;Patient/family education;Vestibular;Neuromuscular re-education    PT Next Visit Plan  N/A - pt to call for follow up appt if vertigo re-occurs    Consulted and Agree with Plan of Care  Patient       Patient will benefit from skilled therapeutic intervention in order to improve the following deficits and impairments:  Dizziness  Visit Diagnosis: Dizziness and giddiness - Plan: PT plan of care cert/re-cert     Problem List There are no active problems to display for this patient.   Kary Kos, PT 02/26/2018, 9:12 PM  [facility] [address] [place], [place], [address] Phone: [phone]   Fax:  [phone]  Name: Kristen Figueroa MRN: [id number] Date of Birth: 07-12-1946

## 2018-03-05 DIAGNOSIS — R42 Dizziness and giddiness: Secondary | ICD-10-CM | POA: Diagnosis not present

## 2018-03-05 DIAGNOSIS — G44201 Tension-type headache, unspecified, intractable: Secondary | ICD-10-CM | POA: Diagnosis not present

## 2018-03-14 DIAGNOSIS — R42 Dizziness and giddiness: Secondary | ICD-10-CM | POA: Diagnosis not present

## 2018-03-14 DIAGNOSIS — R51 Headache: Secondary | ICD-10-CM | POA: Diagnosis not present

## 2018-03-22 DIAGNOSIS — R5382 Chronic fatigue, unspecified: Secondary | ICD-10-CM | POA: Diagnosis not present

## 2018-03-28 DIAGNOSIS — C44519 Basal cell carcinoma of skin of other part of trunk: Secondary | ICD-10-CM | POA: Diagnosis not present

## 2018-03-28 DIAGNOSIS — L821 Other seborrheic keratosis: Secondary | ICD-10-CM | POA: Diagnosis not present

## 2018-03-28 DIAGNOSIS — D485 Neoplasm of uncertain behavior of skin: Secondary | ICD-10-CM | POA: Diagnosis not present

## 2018-05-15 ENCOUNTER — Ambulatory Visit: Payer: Medicare Other | Admitting: Neurology

## 2018-05-22 DIAGNOSIS — E039 Hypothyroidism, unspecified: Secondary | ICD-10-CM | POA: Diagnosis not present

## 2018-05-22 DIAGNOSIS — E7800 Pure hypercholesterolemia, unspecified: Secondary | ICD-10-CM | POA: Diagnosis not present

## 2018-05-22 DIAGNOSIS — Z0000 Encounter for general adult medical examination without abnormal findings: Secondary | ICD-10-CM | POA: Diagnosis not present

## 2018-05-22 DIAGNOSIS — J329 Chronic sinusitis, unspecified: Secondary | ICD-10-CM | POA: Diagnosis not present

## 2018-05-23 ENCOUNTER — Ambulatory Visit: Payer: Medicare Other | Admitting: Neurology

## 2018-05-23 ENCOUNTER — Other Ambulatory Visit: Payer: Self-pay

## 2018-05-23 ENCOUNTER — Encounter: Payer: Self-pay | Admitting: Neurology

## 2018-05-23 ENCOUNTER — Telehealth: Payer: Self-pay | Admitting: *Deleted

## 2018-05-23 DIAGNOSIS — R9089 Other abnormal findings on diagnostic imaging of central nervous system: Secondary | ICD-10-CM

## 2018-05-23 DIAGNOSIS — R55 Syncope and collapse: Secondary | ICD-10-CM

## 2018-05-23 DIAGNOSIS — G4489 Other headache syndrome: Secondary | ICD-10-CM

## 2018-05-23 NOTE — Progress Notes (Signed)
[facility]  PATIENT: Kristen Figueroa DOB: [birth year]  REFERRING DOCTOR OR PCP:  Jani Gravel SOURCE: Patient, notes from Dr. Maudie Mercury, imaging and lab reports, MRI images on PACS.  _________________________________   HISTORICAL  CHIEF COMPLAINT:  Chief Complaint  Patient presents with  . Headache    Intermittent spasm type h/a's onset 2 yrs. ago.    HISTORY OF PRESENT ILLNESS:  I had the pleasure of seeing your patient, Kristen Figueroa, for a neurologic consultaton regarding her HA   She is a 72 yo woman who has had headaches since January.    Also in January, she had an episode of lightheadedness and felt she might pass out.   She sat down and was better.   Two weeks later, while in the bathroom, she had a hot flash sensation and then she felt lightheaded and felt she would pass out.   She sat down and she started feeling better.  Each episode lasted 1-2 minutes with lightheadedness.   However, after both episodes, she felt foggy and then had neck pain, headache, ringing in her ears lasting another 5 minute.  She denied major visual disturbances but has felt vision is more blurry the past year (bilateral) and plans to see ophthalmology  She had an MRI February 2019 and it shows white matter foci predominantly in the subcortical white matter most consistent with chronic microvascular disease, more than typical for age.  Cardioemboli cannot be ruled out as a source of these findings.  Images were personally reviewed and shown to Kristen Figueroa.   She has not had any further episodes since February.   She is physically active.  She denies any cardiac history.  She has not had any arrhythmias.  Vascular risks:   She smoked 1/2 ppd for mage 21 to age 87.   She does not have HTN or DM.       REVIEW OF SYSTEMS: Constitutional: No fevers, chills, sweats, or change in appetite.    Eyes: No visual changes, double vision, eye pain Ear, nose and throat: No hearing loss, ear pain, nasal  congestion, sore throat.   Frequent sinusitis.  Cardiovascular: No chest pain, palpitations Respiratory: No shortness of breath at rest or with exertion.   No wheezes GastrointestinaI: No nausea, vomiting, diarrhea, abdominal pain, fecal incontinence.   She has GERD.   Genitourinary: She has urge incontinence and has had urodynamics Musculoskeletal: No neck pain, back pain Integumentary: No rash, pruritus, skin lesions Neurological: as above Psychiatric: No depression at this time.  Some anxiety Endocrine: No palpitations, diaphoresis, change in appetite, change in weigh or increased thirst.   She has hypothyroidism  Hematologic/Lymphatic: No anemia, purpura, petechiae. Allergic/Immunologic: No itchy/runny eyes, nasal congestion, recent allergic reactions, rashes  ALLERGIES: Allergies  Allergen Reactions  . Codeine     REACTION: stomach cramps, swelling    HOME MEDICATIONS:  Current Outpatient Medications:  .  ALPRAZolam (XANAX) 0.25 MG tablet, TAKE AS DIRECTED, Disp: 15 tablet, Rfl: 3 .  azithromycin (ZITHROMAX Z-PAK) 250 MG tablet, Take by mouth daily., Disp: , Rfl:  .  Cholecalciferol (VITAMIN D) 2000 units tablet, Take 6,000 Units by mouth daily., Disp: , Rfl:  .  dicyclomine (BENTYL) 10 MG capsule, Take 1 capsule by mouth daily., Disp: , Rfl: 3 .  levothyroxine (SYNTHROID, LEVOTHROID) 50 MCG tablet, Take 50 mcg by mouth daily., Disp: , Rfl:  .  omeprazole (PRILOSEC) 20 MG capsule, TAKE ONE CAPSULE BY MOUTH TWICE A DAY *NEED APPOINTMENT  FOR FURTHER REFILL*, Disp: , Rfl:  .  Probiotic Product (ACIDOPHILUS/GOAT MILK) CAPS, Take 1 tablet by mouth daily., Disp: , Rfl:  .  psyllium (METAMUCIL) 58.6 % powder, Take 1 packet by mouth 3 (three) times daily., Disp: , Rfl:   PAST MEDICAL HISTORY: Past Medical History:  Diagnosis Date  . Allergy    RHINITIS  . Arthritis   . Bradycardia   . Dyslipidemia   . Headache   . Hypothyroidism   . Vision abnormalities     PAST SURGICAL  HISTORY: Past Surgical History:  Procedure Laterality Date  . HIP ARTHROPLASTY Left   . left hip replacement    . NECK LIFT    . TUBAL LIGATION    . TUBAL LIGATION    . UPPER GASTROINTESTINAL ENDOSCOPY  09/07/2010    FAMILY HISTORY: Family History  Problem Relation Age of Onset  . Bone cancer Mother        unknown origin mets to bones  . Thyroid nodules Mother   . Thyroid disease Sister        number of family members, neice, cousin  . Diabetes Paternal Grandfather   . Heart disease Father     SOCIAL HISTORY:  Social History   Socioeconomic History  . Marital status: Married    Spouse name: Not on file  . Number of children: 2  . Years of education: Not on file  . Highest education level: Not on file  Occupational History  . Occupation: Engineer, site  Social Needs  . Financial resource strain: Not on file  . Food insecurity:    Worry: Not on file    Inability: Not on file  . Transportation needs:    Medical: Not on file    Non-medical: Not on file  Tobacco Use  . Smoking status: Former Research scientist (life sciences)  . Smokeless tobacco: Never Used  Substance and Sexual Activity  . Alcohol use: Yes    Alcohol/week: 2.4 oz    Types: 4 Glasses of wine per week  . Drug use: No  . Sexual activity: Not on file  Lifestyle  . Physical activity:    Days per week: Not on file    Minutes per session: Not on file  . Stress: Not on file  Relationships  . Social connections:    Talks on phone: Not on file    Gets together: Not on file    Attends religious service: Not on file    Active member of club or organization: Not on file    Attends meetings of clubs or organizations: Not on file    Relationship status: Not on file  . Intimate partner violence:    Fear of current or ex partner: Not on file    Emotionally abused: Not on file    Physically abused: Not on file    Forced sexual activity: Not on file  Other Topics Concern  . Not on file  Social History Narrative  . Not on  file     PHYSICAL EXAM  Vitals:   05/23/18 1032  BP: 125/69  Resp: 16  Weight: 145 lb 8 oz (66 kg)  Height: 5' 7" (1.702 m)    Body mass index is 22.79 kg/m.   General: The patient is well-developed and well-nourished and in no acute distress  Eyes:  Funduscopic exam shows normal optic discs and retinal vessels.  Neck: The neck is supple, no carotid bruits are noted.  The neck is nontender.  Cardiovascular:  The heart has a regular rate and rhythm with a normal S1 and S2. There were no murmurs, gallops or rubs. Lungs are clear to auscultation.  Skin: Extremities are without significant edema.  Musculoskeletal:  Back is nontender  Neurologic Exam  Mental status: The patient is alert and oriented x 3 at the time of the examination. The patient has apparent normal recent and remote memory, with an apparently normal attention span and concentration ability.   Speech is normal.  Cranial nerves: Extraocular movements are full. Pupils are equal, round, and reactive to light and accomodation.  Visual fields are full.  Facial symmetry is present. There is good facial sensation to soft touch bilaterally.Facial strength is normal.  Trapezius and sternocleidomastoid strength is normal. No dysarthria is noted.  The tongue is midline, and the patient has symmetric elevation of the soft palate. No obvious hearing deficits are noted.  Motor:  Muscle bulk is normal.   Tone is normal. Strength is  5 / 5 in all 4 extremities.   Sensory: Sensory testing is intact to pinprick, soft touch and vibration sensation in all 4 extremities.  Coordination: Cerebellar testing reveals good finger-nose-finger and heel-to-shin bilaterally.  Gait and station: Station is normal.   Gait is normal. Tandem gait is normal. Romberg is negative.   Reflexes: Deep tendon reflexes are symmetric and normal bilaterally.   Plantar responses are flexor.    DIAGNOSTIC DATA (LABS, IMAGING, TESTING) - I reviewed patient  records, labs, notes, testing and imaging myself where available.  Lab Results  Component Value Date   WBC 6.4 01/21/2011   HGB 8.2 (L) 01/21/2011   HCT 24.8 (L) 01/21/2011   MCV 92.2 01/21/2011   PLT 174 01/21/2011      Component Value Date/Time   NA 136 01/21/2011 0540   K 3.5 01/21/2011 0540   CL 102 01/21/2011 0540   CO2 29 01/21/2011 0540   GLUCOSE 119 (H) 01/21/2011 0540   BUN 6 01/21/2011 0540   CREATININE 0.84 01/21/2011 0540   CALCIUM 8.2 (L) 01/21/2011 0540   PROT 6.8 01/14/2011 1435   ALBUMIN 4.2 01/14/2011 1435   AST 22 01/14/2011 1435   ALT 13 01/14/2011 1435   ALKPHOS 59 01/14/2011 1435   BILITOT 0.8 01/14/2011 1435   GFRNONAA >60 01/21/2011 0540   GFRAA  01/21/2011 0540    >60        The eGFR has been calculated using the MDRD equation. This calculation has not been validated in all clinical situations. eGFR's persistently <60 mL/min signify possible Chronic Kidney Disease.       ASSESSMENT AND PLAN  Other headache syndrome - Plan: ECHOCARDIOGRAM COMPLETE BUBBLE STUDY, US Carotid Bilateral  Abnormal brain MRI - Plan: ECHOCARDIOGRAM COMPLETE BUBBLE STUDY, US Carotid Bilateral  Near syncope - Plan: ECHOCARDIOGRAM COMPLETE BUBBLE STUDY, US Carotid Bilateral   In summary, Kristen Figueroa is a 72 year old woman who had 2 episodes of headache associated with transient neurologic symptoms and has an MRI of the brain with abnormal white matter foci.  I discussed with her that the findings on the MRI could be due to chronic microvascular ischemic change but I cannot rule out the possibility of multiple microemboli, either from the carotid artery or the heart.   Therefore, I want to check a carotid Doppler and an echocardiogram with bubble contrast.  She will start a baby aspirin daily.  We will call her with the results of the studies and she will return to see me in  3 months but call sooner if she has new or worsening neurologic symptoms.  Thank you for asking  me to see Kristen Figueroa.  Please let me know if I can be of further assistance with her or other patients in the future.    Richard A. Felecia Shelling, MD, [facility] 6/56/8127, 51:70 AM Certified in Neurology, Clinical Neurophysiology, Sleep Medicine, Pain Medicine and Neuroimaging  [facility] [address], [place], [place] [address] [phone]

## 2018-05-23 NOTE — Telephone Encounter (Signed)
-----   Message from Dana R Cox sent at 05/23/2018  3:22 PM EDT ----- Adysen Raphael will you re- order please  US CAROTID BILATERAL - VAS [id number] Thanks Boo.  

## 2018-05-23 NOTE — Telephone Encounter (Signed)
-----   Message from Christ Kickana R Cox sent at 05/23/2018  3:22 PM EDT ----- Rushie GoltzFaith will you re- order please  US CAROTID BILATERAL - VAS [phone] Thanks Boo.

## 2018-05-27 ENCOUNTER — Encounter (HOSPITAL_COMMUNITY): Payer: Medicare Other

## 2018-05-27 NOTE — Telephone Encounter (Signed)
Victorino DikeJennifer with [facility] Dr. Verl DickerGanji's office called requesting an update on the order for the Doppler that was supposed to be faxed over. Please fax to [phone]. Please call with any questions [phone].

## 2018-05-28 NOTE — Telephone Encounter (Signed)
Faith this Handled she is [facility] July 25 th [facility] Doppler . 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

## 2018-05-28 NOTE — Telephone Encounter (Signed)
Noted/fim 

## 2018-05-29 DIAGNOSIS — K219 Gastro-esophageal reflux disease without esophagitis: Secondary | ICD-10-CM | POA: Diagnosis not present

## 2018-05-29 DIAGNOSIS — I10 Essential (primary) hypertension: Secondary | ICD-10-CM | POA: Diagnosis not present

## 2018-05-29 DIAGNOSIS — Z0000 Encounter for general adult medical examination without abnormal findings: Secondary | ICD-10-CM | POA: Diagnosis not present

## 2018-05-29 DIAGNOSIS — E039 Hypothyroidism, unspecified: Secondary | ICD-10-CM | POA: Diagnosis not present

## 2018-05-29 DIAGNOSIS — R739 Hyperglycemia, unspecified: Secondary | ICD-10-CM | POA: Diagnosis not present

## 2018-06-03 ENCOUNTER — Telehealth: Payer: Self-pay | Admitting: *Deleted

## 2018-06-03 DIAGNOSIS — R55 Syncope and collapse: Secondary | ICD-10-CM

## 2018-06-03 DIAGNOSIS — R9089 Other abnormal findings on diagnostic imaging of central nervous system: Secondary | ICD-10-CM

## 2018-06-03 DIAGNOSIS — G4489 Other headache syndrome: Secondary | ICD-10-CM

## 2018-06-03 NOTE — Telephone Encounter (Signed)
-----   Message from Christ Kickana R Cox sent at 06/03/2018  9:35 AM EDT ----- Tilford PillarFaith Im sorry but order has to be changed because of [address] [facility] [facility] because that's where we have her approved and she has the address for . [id number] [facility] Thanks Annabelle HarmanDana She goes The this week.

## 2018-06-06 ENCOUNTER — Ambulatory Visit (HOSPITAL_COMMUNITY)
Admission: RE | Admit: 2018-06-06 | Discharge: 2018-06-06 | Disposition: A | Discharge status: Home / Self Care | Payer: Medicare Other | Source: Ambulatory Visit | Attending: Neurology | Admitting: Neurology

## 2018-06-06 ENCOUNTER — Encounter (HOSPITAL_COMMUNITY): Payer: Medicare Other

## 2018-06-06 DIAGNOSIS — R9089 Other abnormal findings on diagnostic imaging of central nervous system: Secondary | ICD-10-CM | POA: Diagnosis not present

## 2018-06-06 DIAGNOSIS — R55 Syncope and collapse: Secondary | ICD-10-CM | POA: Insufficient documentation

## 2018-06-06 DIAGNOSIS — G4489 Other headache syndrome: Secondary | ICD-10-CM | POA: Diagnosis not present

## 2018-06-06 NOTE — Telephone Encounter (Signed)
Pt requesting a call from [place], did not wish to discuss with me

## 2018-06-06 NOTE — Progress Notes (Signed)
Carotid duplex prelim: 1-39% ICA stenosis.  Kristen Figueroa, RDMS, RVT   

## 2018-06-07 ENCOUNTER — Telehealth: Payer: Self-pay | Admitting: *Deleted

## 2018-06-07 NOTE — Telephone Encounter (Signed)
Spoke with Maralyn SagoSarah and advised carotid duplex was nml for age.  She verbalized understanding of same/fim

## 2018-06-07 NOTE — Telephone Encounter (Signed)
-----   Message from Asa Lenteichard A Sater, MD sent at 06/06/2018  6:36 PM EDT ----- Please let her know that the carotid ultrasound was normal for age.

## 2018-06-11 NOTE — Telephone Encounter (Signed)
Patient is scheduled for Echo next week  At [facility] Patient is aware.

## 2018-06-13 ENCOUNTER — Ambulatory Visit (HOSPITAL_COMMUNITY)
Admission: RE | Admit: 2018-06-13 | Discharge: 2018-06-13 | Disposition: A | Discharge status: Home / Self Care | Payer: Medicare Other | Source: Ambulatory Visit | Attending: Neurology | Admitting: Neurology

## 2018-06-13 DIAGNOSIS — R001 Bradycardia, unspecified: Secondary | ICD-10-CM | POA: Diagnosis not present

## 2018-06-13 DIAGNOSIS — G4489 Other headache syndrome: Secondary | ICD-10-CM

## 2018-06-13 DIAGNOSIS — I071 Rheumatic tricuspid insufficiency: Secondary | ICD-10-CM | POA: Diagnosis not present

## 2018-06-13 DIAGNOSIS — R9089 Other abnormal findings on diagnostic imaging of central nervous system: Secondary | ICD-10-CM

## 2018-06-13 DIAGNOSIS — R55 Syncope and collapse: Secondary | ICD-10-CM | POA: Diagnosis not present

## 2018-06-13 DIAGNOSIS — E785 Hyperlipidemia, unspecified: Secondary | ICD-10-CM | POA: Insufficient documentation

## 2018-06-13 NOTE — Progress Notes (Signed)
Bubble study done without difficulty.

## 2018-06-14 ENCOUNTER — Inpatient Hospital Stay (HOSPITAL_COMMUNITY)
Admission: RE | Admit: 2018-06-14 | Discharge: 2018-06-14 | Disposition: A | Discharge status: Home / Self Care | Payer: Medicare Other | Source: Ambulatory Visit | Attending: Neurology | Admitting: Neurology

## 2018-06-14 DIAGNOSIS — G4489 Other headache syndrome: Secondary | ICD-10-CM

## 2018-06-14 DIAGNOSIS — R9089 Other abnormal findings on diagnostic imaging of central nervous system: Secondary | ICD-10-CM

## 2018-06-14 DIAGNOSIS — R55 Syncope and collapse: Secondary | ICD-10-CM

## 2018-06-17 ENCOUNTER — Telehealth: Payer: Self-pay | Admitting: *Deleted

## 2018-06-17 NOTE — Telephone Encounter (Signed)
LVM informing the patient that her echocardiogram was normal for her age. Left number for any questions.

## 2018-06-26 DIAGNOSIS — H40033 Anatomical narrow angle, bilateral: Secondary | ICD-10-CM | POA: Diagnosis not present

## 2018-06-26 DIAGNOSIS — H52223 Regular astigmatism, bilateral: Secondary | ICD-10-CM | POA: Diagnosis not present

## 2018-06-26 DIAGNOSIS — H5203 Hypermetropia, bilateral: Secondary | ICD-10-CM | POA: Diagnosis not present

## 2018-07-30 DIAGNOSIS — Z79899 Other long term (current) drug therapy: Secondary | ICD-10-CM | POA: Diagnosis not present

## 2018-07-31 DIAGNOSIS — Z124 Encounter for screening for malignant neoplasm of cervix: Secondary | ICD-10-CM | POA: Diagnosis not present

## 2018-07-31 DIAGNOSIS — H40033 Anatomical narrow angle, bilateral: Secondary | ICD-10-CM | POA: Diagnosis not present

## 2018-07-31 DIAGNOSIS — H25813 Combined forms of age-related cataract, bilateral: Secondary | ICD-10-CM | POA: Diagnosis not present

## 2018-07-31 DIAGNOSIS — Z6822 Body mass index (BMI) 22.0-22.9, adult: Secondary | ICD-10-CM | POA: Diagnosis not present

## 2018-07-31 DIAGNOSIS — Z01419 Encounter for gynecological examination (general) (routine) without abnormal findings: Secondary | ICD-10-CM | POA: Diagnosis not present

## 2018-08-28 ENCOUNTER — Other Ambulatory Visit: Payer: Self-pay

## 2018-08-28 ENCOUNTER — Ambulatory Visit: Payer: Medicare Other | Admitting: Neurology

## 2018-08-28 ENCOUNTER — Encounter: Payer: Self-pay | Admitting: Neurology

## 2018-08-28 VITALS — BP 128/54 | HR 74 | Resp 18 | Ht 67.0 in | Wt 144.5 lb

## 2018-08-28 DIAGNOSIS — R55 Syncope and collapse: Secondary | ICD-10-CM

## 2018-08-28 DIAGNOSIS — G4489 Other headache syndrome: Secondary | ICD-10-CM

## 2018-08-28 DIAGNOSIS — R9089 Other abnormal findings on diagnostic imaging of central nervous system: Secondary | ICD-10-CM | POA: Diagnosis not present

## 2018-08-28 NOTE — Progress Notes (Signed)
[facility]  PATIENT: Kristen Figueroa DOB: 09/02/1946  REFERRING DOCTOR OR PCP:  Jani Gravel SOURCE: Patient, notes from Dr. Maudie Mercury, imaging and lab reports, MRI images on PACS.  _________________________________   HISTORICAL  CHIEF COMPLAINT:  Chief Complaint  Patient presents with  . Headache    Sts. she stopped ASA for a dental procedure and did not restart it.   Sts. h/a's are improved/fim  . Abnormal MRI Brain    HISTORY OF PRESENT ILLNESS:   Kristen Figueroa is a 72 y.o. woman with headaches and an abnormal brain MRI.   Update 08/28/2018:     She reports that her headaches are doing better.    She is getting dental work (implants) and has had more headaches with that.   However, before the dental work, HA's reduced to 1-2 a month.   She gets tension type HA 10-15 times a month.   These all start in the right occiput and sometimes radiate forward. This occurs in the afternoons.     She has not had any further migraines with visual symptoms.     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   She sees Dr. Katy Fitch.      She has had a few dizzy spells often after standing up.   She hs about one spell a week lasting under one minute.      From 01/09/2018 She is a 72 yo woman who has had headaches since January.    Also in January, she had an episode of lightheadedness and felt she might pass out.   She sat down and was better.   Two weeks later, while in the bathroom, she had a hot flash sensation and then she felt lightheaded and felt she would pass out.   She sat down and she started feeling better.  Each episode lasted 1-2 minutes with lightheadedness.   However, after both episodes, she felt foggy and then had neck pain, headache, ringing in her ears lasting another 5 minute.  She denied major visual disturbances but has felt vision is more blurry the past year (bilateral) and plans to see  ophthalmology  She had an MRI February 2019 and it shows white matter foci predominantly in the subcortical white matter most consistent with chronic microvascular disease, more than typical for age.  Cardioemboli cannot be ruled out as a source of these findings.  Images were personally reviewed and shown to Mrs. Stueve.   She has not had any further episodes since February.   She is physically active.  She denies any cardiac history.  She has not had any arrhythmias.  Vascular risks:   She smoked 1/2 ppd for mage 21 to age 48.   She does not have HTN or DM.       REVIEW OF SYSTEMS: Constitutional: No fevers, chills, sweats, or change in appetite.    Eyes: No visual changes, double vision, eye pain Ear, nose and throat: No hearing loss, ear pain, nasal congestion, sore throat.   Frequent sinusitis.  Cardiovascular: No chest pain, palpitations Respiratory: No shortness of breath at rest or with exertion.   No wheezes GastrointestinaI: No nausea, vomiting, diarrhea, abdominal pain, fecal incontinence.   She has GERD.   Genitourinary: She has urge incontinence and has had urodynamics Musculoskeletal: No neck pain, back pain Integumentary: No rash, pruritus, skin lesions Neurological: as above Psychiatric:  No depression at this time.  Some anxiety Endocrine: No palpitations, diaphoresis, change in appetite, change in weigh or increased thirst.   She has hypothyroidism  Hematologic/Lymphatic: No anemia, purpura, petechiae. Allergic/Immunologic: No itchy/runny eyes, nasal congestion, recent allergic reactions, rashes  ALLERGIES: Allergies  Allergen Reactions  . Codeine     REACTION: stomach cramps, swelling    HOME MEDICATIONS:  Current Outpatient Medications:  .  Cholecalciferol (VITAMIN D) 2000 units tablet, Take 6,000 Units by mouth daily., Disp: , Rfl:  .  levothyroxine (SYNTHROID, LEVOTHROID) 50 MCG tablet, Take 50 mcg by mouth daily., Disp: , Rfl:  .  omeprazole (PRILOSEC)  20 MG capsule, TAKE ONE CAPSULE BY MOUTH TWICE A DAY *NEED APPOINTMENT FOR FURTHER REFILL*, Disp: , Rfl:  .  Probiotic Product (ACIDOPHILUS/GOAT MILK) CAPS, Take 1 tablet by mouth daily., Disp: , Rfl:  .  psyllium (METAMUCIL) 58.6 % powder, Take 1 packet by mouth 3 (three) times daily., Disp: , Rfl:  .  ALPRAZolam (XANAX) 0.25 MG tablet, TAKE AS DIRECTED (Patient not taking: Reported on 08/28/2018), Disp: 15 tablet, Rfl: 3  PAST MEDICAL HISTORY: Past Medical History:  Diagnosis Date  . Allergy    RHINITIS  . Arthritis   . Bradycardia   . Dyslipidemia   . Headache   . Hypothyroidism   . Vision abnormalities     PAST SURGICAL HISTORY: Past Surgical History:  Procedure Laterality Date  . HIP ARTHROPLASTY Left   . left hip replacement    . NECK LIFT    . TUBAL LIGATION    . TUBAL LIGATION    . UPPER GASTROINTESTINAL ENDOSCOPY  09/07/2010    FAMILY HISTORY: Family History  Problem Relation Age of Onset  . Bone cancer Mother        unknown origin mets to bones  . Thyroid nodules Mother   . Thyroid disease Sister        number of family members, neice, cousin  . Diabetes Paternal Grandfather   . Heart disease Father     SOCIAL HISTORY:  Social History   Socioeconomic History  . Marital status: Married    Spouse name: Not on file  . Number of children: 2  . Years of education: Not on file  . Highest education level: Not on file  Occupational History  . Occupation: Engineer, site  Social Needs  . Financial resource strain: Not on file  . Food insecurity:    Worry: Not on file    Inability: Not on file  . Transportation needs:    Medical: Not on file    Non-medical: Not on file  Tobacco Use  . Smoking status: Former Research scientist (life sciences)  . Smokeless tobacco: Never Used  Substance and Sexual Activity  . Alcohol use: Yes    Alcohol/week: 4.0 standard drinks    Types: 4 Glasses of wine per week  . Drug use: No  . Sexual activity: Not on file  Lifestyle  . Physical  activity:    Days per week: Not on file    Minutes per session: Not on file  . Stress: Not on file  Relationships  . Social connections:    Talks on phone: Not on file    Gets together: Not on file    Attends religious service: Not on file    Active member of club or organization: Not on file    Attends meetings of clubs or organizations: Not on file    Relationship status: Not on file  .  Intimate partner violence:    Fear of current or ex partner: Not on file    Emotionally abused: Not on file    Physically abused: Not on file    Forced sexual activity: Not on file  Other Topics Concern  . Not on file  Social History Narrative  . Not on file     PHYSICAL EXAM  Vitals:   08/28/18 1123  BP: (!) 128/54  Pulse: 74  Resp: 18  Weight: 144 lb 8 oz (65.5 kg)  Height: 5' 7"  (1.702 m)    Body mass index is 22.63 kg/m.   General: The patient is well-developed and well-nourished and in no acute distress  Eyes:  Funduscopic exam shows normal optic discs and retinal vessels.    She has mild cataracts    Neurologic Exam  Mental status: The patient is alert and oriented x 3 at the time of the examination. The patient has apparent normal recent and remote memory, with an apparently normal attention span and concentration ability.   Speech is normal.  Cranial nerves: Extraocular movements are full. Pupils are equal, round, and reactive to light and accomodation.  Facial strength and sensation was normal.  Trapezius strength was normal. No dysarthria is noted.  The tongue is midline, and the patient has symmetric elevation of the soft palate. No obvious hearing deficits are noted.  Motor:  Muscle bulk is normal.   Tone is normal. Strength is  5 / 5 in all 4 extremities.   Sensory: Sensory testing is intact to pinprick, soft touch and vibration sensation in all 4 extremities.  Coordination: Cerebellar testing reveals good finger-nose-finger and heel-to-shin bilaterally.  Gait and  station: Station is normal.   Gait and tandem walk are normal.  Romberg is negative.   Reflexes: Deep tendon reflexes are symmetric and normal bilaterally.        DIAGNOSTIC DATA (LABS, IMAGING, TESTING) - I reviewed patient records, labs, notes, testing and imaging myself where available.  Lab Results  Component Value Date   WBC 6.4 01/21/2011   HGB 8.2 (L) 01/21/2011   HCT 24.8 (L) 01/21/2011   MCV 92.2 01/21/2011   PLT 174 01/21/2011      Component Value Date/Time   NA 136 01/21/2011 0540   K 3.5 01/21/2011 0540   CL 102 01/21/2011 0540   CO2 29 01/21/2011 0540   GLUCOSE 119 (H) 01/21/2011 0540   BUN 6 01/21/2011 0540   CREATININE 0.84 01/21/2011 0540   CALCIUM 8.2 (L) 01/21/2011 0540   PROT 6.8 01/14/2011 1435   ALBUMIN 4.2 01/14/2011 1435   AST 22 01/14/2011 1435   ALT 13 01/14/2011 1435   ALKPHOS 59 01/14/2011 1435   BILITOT 0.8 01/14/2011 1435   GFRNONAA >60 01/21/2011 0540   GFRAA  01/21/2011 0540    >60        The eGFR has been calculated using the MDRD equation. This calculation has not been validated in all clinical situations. eGFR's persistently <60 mL/min signify possible Chronic Kidney Disease.       ASSESSMENT AND PLAN  No diagnosis found.  1.   Resume aspirin 81 mg daily. 2.    Stay active and exercise as tolerated. 3.    A splenius capitis/occipital nerve trigger point was offered to help with her occipital neuralgia type headaches.  She would prefer to hold off on the shot at this point. 4.    Return in 1 year or earlier if new or worsening neurologic  symptoms  Christifer Chapdelaine A. Felecia Shelling, MD, [facility] 40/37/0964, 38:38 AM Certified in Neurology, Clinical Neurophysiology, Sleep Medicine, Pain Medicine and Neuroimaging  [facility] [address], [place], [place] [address] [phone]

## 2018-08-30 DIAGNOSIS — Z23 Encounter for immunization: Secondary | ICD-10-CM | POA: Diagnosis not present

## 2018-09-18 ENCOUNTER — Other Ambulatory Visit: Payer: Self-pay | Admitting: Internal Medicine

## 2018-09-18 DIAGNOSIS — Z1231 Encounter for screening mammogram for malignant neoplasm of breast: Secondary | ICD-10-CM

## 2018-09-24 ENCOUNTER — Telehealth: Payer: Self-pay | Admitting: Neurology

## 2018-09-24 NOTE — Telephone Encounter (Signed)
Patient has been dizzy since yesterday and calling for an appointment with Dr. Epimenio FootSater. I advised Dr. Epimenio FootSater is out of the office and will send to his nurse.

## 2018-09-24 NOTE — Telephone Encounter (Signed)
Called, LVM for pt returning her call. 

## 2018-09-25 ENCOUNTER — Other Ambulatory Visit: Payer: Self-pay

## 2018-09-25 ENCOUNTER — Observation Stay (HOSPITAL_COMMUNITY)
Admission: AD | Admit: 2018-09-25 | Discharge: 2018-09-26 | Disposition: A | Discharge status: Home / Self Care | Payer: Medicare Other | Source: Ambulatory Visit | Attending: Cardiology | Admitting: Cardiology

## 2018-09-25 ENCOUNTER — Encounter (HOSPITAL_COMMUNITY): Payer: Self-pay

## 2018-09-25 DIAGNOSIS — M1990 Unspecified osteoarthritis, unspecified site: Secondary | ICD-10-CM | POA: Insufficient documentation

## 2018-09-25 DIAGNOSIS — E039 Hypothyroidism, unspecified: Secondary | ICD-10-CM | POA: Insufficient documentation

## 2018-09-25 DIAGNOSIS — E785 Hyperlipidemia, unspecified: Secondary | ICD-10-CM | POA: Insufficient documentation

## 2018-09-25 DIAGNOSIS — Z833 Family history of diabetes mellitus: Secondary | ICD-10-CM | POA: Insufficient documentation

## 2018-09-25 DIAGNOSIS — Z79899 Other long term (current) drug therapy: Secondary | ICD-10-CM | POA: Insufficient documentation

## 2018-09-25 DIAGNOSIS — Z9851 Tubal ligation status: Secondary | ICD-10-CM | POA: Diagnosis not present

## 2018-09-25 DIAGNOSIS — R51 Headache: Secondary | ICD-10-CM | POA: Insufficient documentation

## 2018-09-25 DIAGNOSIS — R55 Syncope and collapse: Secondary | ICD-10-CM | POA: Diagnosis present

## 2018-09-25 DIAGNOSIS — H539 Unspecified visual disturbance: Secondary | ICD-10-CM | POA: Diagnosis not present

## 2018-09-25 DIAGNOSIS — J310 Chronic rhinitis: Secondary | ICD-10-CM | POA: Insufficient documentation

## 2018-09-25 DIAGNOSIS — I495 Sick sinus syndrome: Principal | ICD-10-CM | POA: Insufficient documentation

## 2018-09-25 DIAGNOSIS — Z96642 Presence of left artificial hip joint: Secondary | ICD-10-CM | POA: Diagnosis not present

## 2018-09-25 DIAGNOSIS — G9001 Carotid sinus syncope: Secondary | ICD-10-CM | POA: Diagnosis not present

## 2018-09-25 DIAGNOSIS — Z8249 Family history of ischemic heart disease and other diseases of the circulatory system: Secondary | ICD-10-CM | POA: Diagnosis not present

## 2018-09-25 DIAGNOSIS — Z8349 Family history of other endocrine, nutritional and metabolic diseases: Secondary | ICD-10-CM | POA: Diagnosis not present

## 2018-09-25 DIAGNOSIS — Z808 Family history of malignant neoplasm of other organs or systems: Secondary | ICD-10-CM | POA: Insufficient documentation

## 2018-09-25 DIAGNOSIS — Z87891 Personal history of nicotine dependence: Secondary | ICD-10-CM | POA: Diagnosis not present

## 2018-09-25 LAB — CBC
HCT: 40.2 % (ref 36.0–46.0)
Hemoglobin: 13 g/dL (ref 12.0–15.0)
MCH: 30.4 pg (ref 26.0–34.0)
MCHC: 32.3 g/dL (ref 30.0–36.0)
MCV: 94.1 fL (ref 80.0–100.0)
PLATELETS: 199 10*3/uL (ref 150–400)
RBC: 4.27 MIL/uL (ref 3.87–5.11)
RDW: 12.3 % (ref 11.5–15.5)
WBC: 5.8 10*3/uL (ref 4.0–10.5)
nRBC: 0 % (ref 0.0–0.2)

## 2018-09-25 LAB — BASIC METABOLIC PANEL WITH GFR
Anion gap: 5 (ref 5–15)
BUN: 18 mg/dL (ref 8–23)
CO2: 30 mmol/L (ref 22–32)
Calcium: 9.3 mg/dL (ref 8.9–10.3)
Chloride: 107 mmol/L (ref 98–111)
Creatinine, Ser: 1.08 mg/dL — ABNORMAL HIGH (ref 0.44–1.00)
GFR calc Af Amer: 58 mL/min — ABNORMAL LOW
GFR calc non Af Amer: 50 mL/min — ABNORMAL LOW
Glucose, Bld: 89 mg/dL (ref 70–99)
Potassium: 4.1 mmol/L (ref 3.5–5.1)
Sodium: 142 mmol/L (ref 135–145)

## 2018-09-25 LAB — TSH: TSH: 0.752 u[IU]/mL (ref 0.350–4.500)

## 2018-09-25 MED ORDER — HEPARIN SODIUM (PORCINE) 5000 UNIT/ML IJ SOLN
5000.0000 [IU] | Freq: Three times a day (TID) | INTRAMUSCULAR | Status: DC
  Administered 2018-09-25 – 2018-09-26 (×2): 5000 [IU] via SUBCUTANEOUS
  Filled 2018-09-25 (×2): qty 1

## 2018-09-25 NOTE — Telephone Encounter (Signed)
Called, LVM for pt again since no return call.

## 2018-09-26 DIAGNOSIS — R42 Dizziness and giddiness: Secondary | ICD-10-CM | POA: Insufficient documentation

## 2018-09-26 DIAGNOSIS — G9001 Carotid sinus syncope: Secondary | ICD-10-CM | POA: Diagnosis not present

## 2018-09-26 DIAGNOSIS — R55 Syncope and collapse: Secondary | ICD-10-CM | POA: Diagnosis not present

## 2018-09-26 DIAGNOSIS — M1990 Unspecified osteoarthritis, unspecified site: Secondary | ICD-10-CM | POA: Diagnosis not present

## 2018-09-26 DIAGNOSIS — Z79899 Other long term (current) drug therapy: Secondary | ICD-10-CM | POA: Diagnosis not present

## 2018-09-26 DIAGNOSIS — R51 Headache: Secondary | ICD-10-CM | POA: Diagnosis not present

## 2018-09-26 DIAGNOSIS — J310 Chronic rhinitis: Secondary | ICD-10-CM | POA: Diagnosis not present

## 2018-09-26 DIAGNOSIS — E785 Hyperlipidemia, unspecified: Secondary | ICD-10-CM | POA: Diagnosis not present

## 2018-09-26 DIAGNOSIS — Z808 Family history of malignant neoplasm of other organs or systems: Secondary | ICD-10-CM | POA: Diagnosis not present

## 2018-09-26 DIAGNOSIS — I495 Sick sinus syndrome: Secondary | ICD-10-CM | POA: Diagnosis not present

## 2018-09-26 DIAGNOSIS — Z8349 Family history of other endocrine, nutritional and metabolic diseases: Secondary | ICD-10-CM | POA: Diagnosis not present

## 2018-09-26 DIAGNOSIS — E039 Hypothyroidism, unspecified: Secondary | ICD-10-CM | POA: Diagnosis not present

## 2018-09-26 DIAGNOSIS — Z8249 Family history of ischemic heart disease and other diseases of the circulatory system: Secondary | ICD-10-CM | POA: Diagnosis not present

## 2018-09-26 DIAGNOSIS — Z96642 Presence of left artificial hip joint: Secondary | ICD-10-CM | POA: Diagnosis not present

## 2018-09-26 DIAGNOSIS — Z833 Family history of diabetes mellitus: Secondary | ICD-10-CM | POA: Diagnosis not present

## 2018-09-26 DIAGNOSIS — H539 Unspecified visual disturbance: Secondary | ICD-10-CM | POA: Diagnosis not present

## 2018-09-26 DIAGNOSIS — Z87891 Personal history of nicotine dependence: Secondary | ICD-10-CM | POA: Diagnosis not present

## 2018-09-26 DIAGNOSIS — Z9851 Tubal ligation status: Secondary | ICD-10-CM | POA: Diagnosis not present

## 2018-09-26 MED ORDER — ACETAMINOPHEN 500 MG PO TABS: 500.0000 mg | ORAL_TABLET | Freq: Four times a day (QID) | ORAL | Status: DC | PRN

## 2018-09-26 MED ORDER — LEVOTHYROXINE SODIUM 50 MCG PO TABS
50.0000 ug | ORAL_TABLET | Freq: Every day | ORAL | Status: DC
  Administered 2018-09-26: 50 ug via ORAL
  Filled 2018-09-26: qty 1

## 2018-09-26 MED ORDER — VITAMIN D 25 MCG (1000 UNIT) PO TABS
5000.0000 [IU] | ORAL_TABLET | Freq: Every day | ORAL | Status: DC
  Administered 2018-09-26: 5000 [IU] via ORAL
  Filled 2018-09-26: qty 5

## 2018-09-26 MED ORDER — PANTOPRAZOLE SODIUM 40 MG PO TBEC
40.0000 mg | DELAYED_RELEASE_TABLET | Freq: Every day | ORAL | Status: DC
  Administered 2018-09-26: 40 mg via ORAL
  Filled 2018-09-26: qty 1

## 2018-09-26 NOTE — Discharge Summary (Signed)
Physician Discharge Summary  Patient ID: Kristen SpainSara H Piechowski MRN: [id number] DOB/AGE: 70/02/1946 72 y.o.  Admit date: 09/25/2018 Discharge date: 09/26/2018  Admission Diagnoses: Near syncope  Discharge Diagnoses:  Active Problems:   Near syncope   Hypersensitive carotid sinus syndrome   Discharged Condition: Stable  Hospital Course:   72 y/o Caucasian female with recurrent near syncopal episodes, admitted for telemetry monitoring. Patient's episodes have occurred with certain movements- such as Pilates, reaching over the head to turn the heat on. On 09/25/18 in the office and 09/26/18 in the hospital, near syncopal episodes were reproducible with carotid sinus massage. Longest documented sinus pause with near syncopal symptoms lasted for 5.2 sec on 1114 (see attached image on the chart) with right carotid sinus massage in supine position. Patient was otherwise hemodynamically and electrically stable. Patient will be scheduled a close follow up with EP Dr. Elberta Fortisamnitz.   Consults: EP  Significant Diagnostic Studies:  Results for Kristen SpainCLARK, Aalyah H (MRN [id number]) as of 09/26/2018 19:08  Ref. Range 09/25/2018 17:32  BASIC METABOLIC PANEL Unknown Rpt (A)  Sodium Latest Ref Range: 135 - 145 mmol/L 142  Potassium Latest Ref Range: 3.5 - 5.1 mmol/L 4.1  Chloride Latest Ref Range: 98 - 111 mmol/L 107  CO2 Latest Ref Range: 22 - 32 mmol/L 30  Glucose Latest Ref Range: 70 - 99 mg/dL 89  BUN Latest Ref Range: 8 - 23 mg/dL 18  Creatinine Latest Ref Range: 0.44 - 1.00 mg/dL 0.861.08 (H)  Calcium Latest Ref Range: 8.9 - 10.3 mg/dL 9.3  Anion gap Latest Ref Range: 5 - 15  5  GFR, Est Non African American Latest Ref Range: >60 mL/min 50 (L)  GFR, Est African American Latest Ref Range: >60 mL/min 58 (L)   Results for Kristen SpainCLARK, Elysabeth H (MRN [id number]) as of 09/26/2018 19:08  Ref. Range 09/25/2018 17:32  WBC Latest Ref Range: 4.0 - 10.5 K/uL 5.8  RBC Latest Ref Range: 3.87 - 5.11 MIL/uL 4.27  Hemoglobin Latest Ref  Range: 12.0 - 15.0 g/dL 52.813.0  HCT Latest Ref Range: 36.0 - 46.0 % 40.2  MCV Latest Ref Range: 80.0 - 100.0 fL 94.1  MCH Latest Ref Range: 26.0 - 34.0 pg 30.4  MCHC Latest Ref Range: 30.0 - 36.0 g/dL 41.332.3  RDW Latest Ref Range: 11.5 - 15.5 % 12.3  Platelets Latest Ref Range: 150 - 400 K/uL 199  nRBC Latest Ref Range: 0.0 - 0.2 % 0.0   Echocardiogram 06/13/2018:  - LVEF 65-70%, normal wall thickness, normal wall motion, normal   diastolic function, trivial MR, normal LA size, mild TR, RVSP 27   mmHg, normal IVC, negative for PFO by saline microbubble   contrast.  Carotid US 06/06/2018: Right Carotid: Velocities in the right ICA are consistent with a 1-39% stenosis.  Left Carotid: Velocities in the left ICA are consistent with a 1-39% stenosis.  MRI Brain 01/09/2018: 1. No specific or reversible finding to explain symptoms. 2. Mild white matter disease that is nonspecific but usually from chronic small vessel ischemia.   Treatments:  EP consultation for possible pacemeker placement.   Discharge Exam: Blood pressure (!) 129/57, pulse (!) 46, temperature 98.3 F (36.8 C), temperature source Oral, resp. rate 18, height 5\' 7"  (1.702 m), SpO2 100 %.  Physical Exam  Constitutional: She is oriented to person, place, and time. She appears well-developed and well-nourished. No distress.  HENT:  Head: Normocephalic and atraumatic.  Eyes: Conjunctivae are normal.  Neck: Normal range of motion. Neck supple.  No JVD present.  Cardiovascular: Regular rhythm, S1 normal, S2 normal and intact distal pulses. Bradycardia present.  No murmur heard. No carotid bruit  Pulmonary/Chest: Effort normal and breath sounds normal. She has no wheezes.  Abdominal: Soft. Bowel sounds are normal.  Musculoskeletal: Normal range of motion. She exhibits edema.  Lymphadenopathy:    She has no cervical adenopathy.  Neurological: She is alert and oriented to person, place, and time.  Skin: Skin is warm and  dry.  Psychiatric: She has a normal mood and affect.  Nursing note and vitals reviewed.   Disposition:   Discharge Instructions    Diet - low sodium heart healthy   Complete by:  As directed    Increase activity slowly   Complete by:  As directed      Allergies as of 09/26/2018      Reactions   Codeine Other (See Comments)   REACTION: stomach cramps, swelling      Medication List    TAKE these medications   acetaminophen 500 MG tablet Commonly known as:  TYLENOL Take 500 mg by mouth every 6 (six) hours as needed for mild pain.   ALPRAZolam 0.25 MG tablet Commonly known as:  XANAX TAKE AS DIRECTED What changed:    how much to take  how to take this  when to take this   amoxicillin 500 MG capsule Commonly known as:  AMOXIL Take 2,000 mg by mouth See admin instructions. Take 1 hour prior to dental appointments   levothyroxine 50 MCG tablet Commonly known as:  SYNTHROID, LEVOTHROID Take 50 mcg by mouth daily.   Magnesium 400 MG Caps Take 400 mg by mouth at bedtime.   omeprazole 20 MG capsule Commonly known as:  PRILOSEC Take 20 mg by mouth daily.   Vitamin D3 125 MCG (5000 UT) Caps Take 5,000 Units by mouth daily.        SignedElder Negus 09/26/2018, 7:03 PM   Wilbur Oakland Emiliano Dyer, MD [facility] Pager: [phone] Office: [phone] If no answer Cell [phone]

## 2018-09-26 NOTE — Progress Notes (Signed)
Right carotid sinus [facility]/18 8:50 [facility].  Truett MainlandManish Patwardhan, MD

## 2018-09-26 NOTE — H&P (Signed)
Kristen Figueroa is an 72 y.o. female.   Chief Complaint: Near syncope HPI: Kristen Figueroa  is a 72 y.o. female  With  No significant cardiac history, no HTN, hyperlipidemia, non smoker, hypothyroidism initally seen by [place] in 2016 for chest pain and PVC's. Had negative treadmill stress test at that time. She was last seen in Feb 2019 for 2 episodes of dizziness, but as symtpoms had resolved further evaluation was not performed, but advised follow up if symtpoms reoccured. She had been doing well until 3 days ago when she again started having dizziness and near syncope and now presents for follow up.  Patient reports she had been doing well until 3 days ago when she again had dizziness and feeling as though she would pass out.  He reports having associated minimal cloudiness and clamminess with her episodes.  Has not had any syncope.  No heart racing, chest pain, or shortness of breath.  One episode occurred while exercising and persisted for several hours after resting.  Did have one episode this morning where she felt extremely weak and fell to the ground, but did not lose consciousness.    She did undergo evaluation from neurology with Dr. Ladonna Snide in July 2019.  Had MRI of the brain showing small vessel ischemic changes likely related to her age, but was scheduled for echo with bubble study that was essentially normal.  No PFO was detected.  Had a carotid duplex revealing 1-39% stenosis.   Past Medical History:  Diagnosis Date  . Allergy    RHINITIS  . Arthritis   . Bradycardia   . Dyslipidemia   . Headache   . Hypothyroidism   . Vision abnormalities     Past Surgical History:  Procedure Laterality Date  . HIP ARTHROPLASTY Left   . left hip replacement    . NECK LIFT    . TUBAL LIGATION    . TUBAL LIGATION    . UPPER GASTROINTESTINAL ENDOSCOPY  09/07/2010    Family History  Problem Relation Age of Onset  . Bone cancer Mother        unknown origin mets to bones  . Thyroid nodules Mother    . Thyroid disease Sister        number of family members, neice, cousin  . Diabetes Paternal Grandfather   . Heart disease Father    Social History:  reports that she has quit smoking. She has never used smokeless tobacco. She reports that she drinks about 4.0 standard drinks of alcohol per week. She reports that she does not use drugs.  Allergies:  Allergies  Allergen Reactions  . Codeine Other (See Comments)    REACTION: stomach cramps, swelling   Review of Systems  Constitutional: Negative.   HENT: Negative.   Eyes: Negative.   Respiratory: Negative.   Cardiovascular: Negative.   Gastrointestinal: Positive for heartburn (occasional).  Genitourinary: Negative.   Musculoskeletal: Negative.   Skin: Negative.   Neurological: Positive for dizziness.  Endo/Heme/Allergies: Negative.   Psychiatric/Behavioral: Negative.   All other systems reviewed and are negative.  Blood pressure 123/70, pulse (!) 51, temperature 98.7 F (37.1 C), temperature source Oral, resp. rate 18, height 5' 7"  (1.702 m), SpO2 97 %. Body mass index is 22.63 kg/m. Physical Exam  Constitutional: She is oriented to person, place, and time. She appears well-developed and well-nourished.  HENT:  Head: Atraumatic.  Eyes: Conjunctivae are normal.  Neck: Neck supple. No JVD present. No thyromegaly present.  Cardiovascular: Normal  rate, regular rhythm, normal heart sounds and intact distal pulses.  No murmur heard. Pulmonary/Chest: Effort normal and breath sounds normal.  Abdominal: Soft. Bowel sounds are normal.  Musculoskeletal: Normal range of motion. She exhibits no edema.  Neurological: She is alert and oriented to person, place, and time.  Skin: Skin is warm and dry.  Psychiatric: She has a normal mood and affect.  Vitals reviewed.  Results for orders placed or performed during the hospital encounter of 09/25/18 (from the past 48 hour(s))  CBC     Status: None   Collection Time: 09/25/18  5:32 PM   Result Value Ref Range   WBC 5.8 4.0 - 10.5 K/uL   RBC 4.27 3.87 - 5.11 MIL/uL   Hemoglobin 13.0 12.0 - 15.0 g/dL   HCT 40.2 36.0 - 46.0 %   MCV 94.1 80.0 - 100.0 fL   MCH 30.4 26.0 - 34.0 pg   MCHC 32.3 30.0 - 36.0 g/dL   RDW 12.3 11.5 - 15.5 %   Platelets 199 150 - 400 K/uL   nRBC 0.0 0.0 - 0.2 %    Comment: Performed at [facility], [place] [address] [place], [place] [address]  Basic metabolic panel     Status: Abnormal   Collection Time: 09/25/18  5:32 PM  Result Value Ref Range   Sodium 142 135 - 145 mmol/L   Potassium 4.1 3.5 - 5.1 mmol/L   Chloride 107 98 - 111 mmol/L   CO2 30 22 - 32 mmol/L   Glucose, Bld 89 70 - 99 mg/dL   BUN 18 8 - 23 mg/dL   Creatinine, Ser 1.08 (H) 0.44 - 1.00 mg/dL   Calcium 9.3 8.9 - 10.3 mg/dL   GFR calc non Af Amer 50 (L) >60 mL/min   GFR calc Af Amer 58 (L) >60 mL/min    Comment: (NOTE) The eGFR has been calculated using the CKD EPI equation. This calculation has not been validated in all clinical situations. eGFR's persistently <60 mL/min signify possible Chronic Kidney Disease.    Anion gap 5 5 - 15    Comment: Performed at [facility], [place] [address], [place], [place] [address]  TSH     Status: None   Collection Time: 09/25/18  5:32 PM  Result Value Ref Range   TSH 0.752 0.350 - 4.500 uIU/mL    Comment: Performed by a 3rd Generation assay with a functional sensitivity of <=0.01 uIU/mL. Performed at [facility], [place] [address] [place], [place] [address]     TSH Recent Labs    09/25/18 1732  TSH 0.752    Medications Prior to Admission  Medication Sig Dispense Refill  . acetaminophen (TYLENOL) 500 MG tablet Take 500 mg by mouth every 6 (six) hours as needed for mild pain.    Marland Kitchen ALPRAZolam (XANAX) 0.25 MG tablet TAKE AS DIRECTED (Patient taking differently: Take 0.25 mg by mouth at bedtime. ) 15 tablet 3  . amoxicillin (AMOXIL) 500 MG capsule Take 2,000 mg by mouth See  admin instructions. Take 1 hour prior to dental appointments  2  . Cholecalciferol (VITAMIN D3) 125 MCG (5000 UT) CAPS Take 5,000 Units by mouth daily.    Marland Kitchen levothyroxine (SYNTHROID, LEVOTHROID) 50 MCG tablet Take 50 mcg by mouth daily.    . Magnesium 400 MG CAPS Take 400 mg by mouth at bedtime.    Marland Kitchen omeprazole (PRILOSEC) 20 MG capsule Take 20 mg by mouth daily.  Current Facility-Administered Medications:  .  acetaminophen (TYLENOL) tablet 500 mg, 500 mg, Oral, Q6H PRN, Adrian Prows, MD .  heparin injection 5,000 Units, 5,000 Units, Subcutaneous, Q8H, Adrian Prows, MD, 5,000 Units at 09/26/18 0608 .  levothyroxine (SYNTHROID, LEVOTHROID) tablet 50 mcg, 50 mcg, Oral, Daily, Adrian Prows, MD .  pantoprazole (PROTONIX) EC tablet 40 mg, 40 mg, Oral, Daily, Adrian Prows, MD .  Vitamin D3 CAPS 5,000 Units, 5,000 Units, Oral, Daily, Adrian Prows, MD  CARDIAC STUDIES:  Outpatient GXT: 10/25/2015: Indication: Palpitation, CP The patient exercised according to Bruce Protocol, Total time recorded  7:00 min achieving max heart rate of  140 which was  92 % of MPHR for age and  8.58 METS of work.  Normal BP response. Resting ECG showing NSR. There was no ST-T changes of ischemia with exercise stress test. Stress terminated due to shortness of breath and THR >85% met.  Echo with bubble study at [facility] 06/13/2018: Left ventricle: The cavity size was normal. Wall thickness was   normal. Systolic function was vigorous. The estimated ejection   fraction was in the range of 65% to 70%. Wall motion was normal;   there were no regional wall motion abnormalities. Left   ventricular diastolic function parameters were normal. - Mitral valve: Mildly thickened leaflets . There was trivial  regurgitation. - Left atrium: The atrium was normal in size. - Atrial septum: No defect or patent foramen ovale was identified  by saline microbubble contrast. - Tricuspid valve: There was mild regurgitation. PA peak pressure: 27 mm Hg  (S). - Inferior vena cava: The vessel was normal in size. The  respirophasic diameter changes were in the normal range (>= 50%),  consistent with normal central venous pressure.   EKG with carotid massage 09/25/2018: Sinus arrest with junctional escape rhythm at rate of 54 bpm, sinus node activity was then noted last 3 beats, 1.8 Sec sinus pause noted, however, it was at least 2.4 seconds live, which was not captured on EKG.  EKG 09/25/2018: Marked sinus bradycardia at rate of 40 bpm with carotid massage eliciting sinus arrest with junctional escape, 1.6 sec.noted.  EKG 09/25/2018 baseline: Sinus bradycardia at rate of 48 bpm, marked sinus arrhythmia. Baseline artifact.  Assessment/Plan Near syncope Sick sinus syndrome due to sinus node dysfunction Carotid hypersensitivity. Bradycardia  Recommendation: I had seen her for occasional dizziness in March 2019, treadmill stress test and echocardiogram are unremarkable. But over the past 3 days she has had frequent episodes of near syncope.  Patient referred to me for recent onset of frequent episodes of near syncopal spells and dizziness. Physical examination is unremarkable except for bradycardia.  I performed carotid artery massage, patient had marked bradycardia and a 2.4 Sec pause and also felt dizzy and she had junctional escape.  I suspect her symptoms are related to sinus node dysfunction. She has had near syncopal spells regularly on a daily basis, she may benefit from EP evaluation and possible implantation of a pacemaker. I'll refer her for EP evaluation. I'll see her back after this.  Addendum: Patient was waiting for additional decision making, she felt extremely weak, heart rate dropping down to 30, clammy, I'll admit the patient for observation and possible EP evaluation.  Adrian Prows, MD 09/26/2018, [facility]. [place] Pager: [phone] Office: [phone] If no answer: Cell:  [phone]

## 2018-09-26 NOTE — Progress Notes (Addendum)
Patient called out from room. Patient  c/o dizziness with nausea. See flow sheet for vs stable. Cardiologist office contacted to report.

## 2018-09-26 NOTE — Progress Notes (Signed)
Episode of dizziness has resolved. Pt stated that she will contact office should another episode arise. Pt request to go home.

## 2018-09-27 ENCOUNTER — Institutional Professional Consult (permissible substitution): Payer: Medicare Other | Admitting: Internal Medicine

## 2018-09-30 NOTE — Telephone Encounter (Signed)
Spoke with Dr. Epimenio FootSater. He feels this is also cardiology related. He wants her to f/u with them on 10/15/18.

## 2018-09-30 NOTE — Telephone Encounter (Signed)
Patient is returning a call. °

## 2018-09-30 NOTE — Telephone Encounter (Signed)
I called pt. Relayed message below. She verbalized understanding and appreciation.

## 2018-09-30 NOTE — Telephone Encounter (Signed)
I called patient back. She had another dizzy spell last week.  She did see cardiologist who recommends she get a pacemaker. She has appointment on 10/15/18 to speak about this with them. Has bradycardia. She reports dizziness where she is having to sit down. She gets light-headed. She is wanting to speak with Dr. Epimenio FootSater about this prior to moving forward with pacemaker. She was requesting appt. Advised I willl speak with Dr. Epimenio FootSater and call her back to let her know his suggestions.

## 2018-10-02 DIAGNOSIS — H52223 Regular astigmatism, bilateral: Secondary | ICD-10-CM | POA: Diagnosis not present

## 2018-10-02 DIAGNOSIS — H40033 Anatomical narrow angle, bilateral: Secondary | ICD-10-CM | POA: Diagnosis not present

## 2018-10-02 DIAGNOSIS — H5203 Hypermetropia, bilateral: Secondary | ICD-10-CM | POA: Diagnosis not present

## 2018-10-03 DIAGNOSIS — Z85828 Personal history of other malignant neoplasm of skin: Secondary | ICD-10-CM | POA: Diagnosis not present

## 2018-10-03 DIAGNOSIS — Z0189 Encounter for other specified special examinations: Secondary | ICD-10-CM | POA: Diagnosis not present

## 2018-10-03 DIAGNOSIS — L821 Other seborrheic keratosis: Secondary | ICD-10-CM | POA: Diagnosis not present

## 2018-10-03 DIAGNOSIS — R42 Dizziness and giddiness: Secondary | ICD-10-CM | POA: Diagnosis not present

## 2018-10-03 DIAGNOSIS — I495 Sick sinus syndrome: Secondary | ICD-10-CM | POA: Diagnosis not present

## 2018-10-03 DIAGNOSIS — L820 Inflamed seborrheic keratosis: Secondary | ICD-10-CM | POA: Diagnosis not present

## 2018-10-03 DIAGNOSIS — R55 Syncope and collapse: Secondary | ICD-10-CM | POA: Diagnosis not present

## 2018-10-07 ENCOUNTER — Telehealth: Payer: Self-pay | Admitting: Neurology

## 2018-10-07 ENCOUNTER — Encounter: Payer: Self-pay | Admitting: Cardiology

## 2018-10-07 ENCOUNTER — Ambulatory Visit (INDEPENDENT_AMBULATORY_CARE_PROVIDER_SITE_OTHER): Payer: Medicare Other | Admitting: Cardiology

## 2018-10-07 VITALS — BP 142/62 | HR 61 | Ht 67.0 in | Wt 144.0 lb

## 2018-10-07 DIAGNOSIS — R55 Syncope and collapse: Secondary | ICD-10-CM

## 2018-10-07 NOTE — Telephone Encounter (Signed)
She saw Dr. Fabio PiercePatrwardhan [facility]).  She had had episodes of syncope and near syncope and some were reproducible with carotid sinus massage.  On 09/26/2018, she had a 5.2-second pause with carotid massage but the rest of her evaluation was negative.  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  She continues to have dizziness that appears unrelated.

## 2018-10-07 NOTE — Patient Instructions (Signed)
Medication Instructions:    Your physician recommends that you continue on your current medications as directed. Please refer to the Current Medication list given to you today.  - If you need a refill on your cardiac medications before your next appointment, please call your pharmacy.   Labwork:  None ordered  Testing/Procedures:  None ordered  Follow-Up:  Your physician recommends that you schedule a follow-up appointment in: 3 months with Dr. Camnitz.  Thank you for choosing [facility]!!   Dore Oquin, RN [phone]         

## 2018-10-07 NOTE — Progress Notes (Signed)
Electrophysiology Office Note   Date:  10/07/2018   ID:  Kristen Figueroa, Kristen Figueroa [birth year], MRN [id number]  PCP:  Kristen Grippe, MD  Cardiologist:  Jacinto Halim Primary Electrophysiologist:  Regan Lemming, MD    No chief complaint on file.    History of Present Illness: Kristen Figueroa is a 72 y.o. female who is being seen today for the evaluation of near syncope at the request of Kristen Decamp, MD. Presenting today for electrophysiology evaluation.  He has a history of hypothyroidism, hyperlipidemia.  She was having recurrent near syncopal episodes and was admitted for telemetry monitoring.  Her episodes occur with certain movements such as Pilates and turning her head.  On 11/13 and 11/14 in the hospital, she had near syncopal episodes which were reproducible with carotid sinus massage.  She had documented sinus pauses with near syncopal symptoms that lasted for 5.2 seconds.  She felt dizzy for the rest of the day after that incident.  She then followed back up in cardiology clinic complaining of dizziness.  She said that she had a room spinning.  At times she feels like the room is moving sideways.  She was noted to not have any rhythm abnormalities at that time.  Today, she denies symptoms of palpitations, chest pain, shortness of breath, orthopnea, PND, lower extremity edema, claudication, dizziness, presyncope, syncope, bleeding, or neurologic sequela. The patient is tolerating medications without difficulties.    Past Medical History:  Diagnosis Date  . Allergy    RHINITIS  . Arthritis   . Bradycardia   . Dyslipidemia   . Headache   . Hypothyroidism   . Vision abnormalities    Past Surgical History:  Procedure Laterality Date  . HIP ARTHROPLASTY Left   . left hip replacement    . NECK LIFT    . TUBAL LIGATION    . TUBAL LIGATION    . UPPER GASTROINTESTINAL ENDOSCOPY  09/07/2010     Current Outpatient Medications  Medication Sig Dispense Refill  . acetaminophen (TYLENOL) 500 MG  tablet Take 500 mg by mouth every 6 (six) hours as needed for mild pain.    Marland Kitchen ALPRAZolam (XANAX) 0.25 MG tablet TAKE AS DIRECTED (Patient taking differently: Take 0.25 mg by mouth at bedtime. ) 15 tablet 3  . amoxicillin (AMOXIL) 500 MG capsule Take 2,000 mg by mouth See admin instructions. Take 1 hour prior to dental appointments  2  . Cholecalciferol (VITAMIN D3) 125 MCG (5000 UT) CAPS Take 5,000 Units by mouth daily.    Marland Kitchen levothyroxine (SYNTHROID, LEVOTHROID) 50 MCG tablet Take 50 mcg by mouth daily.    . Magnesium 400 MG CAPS Take 400 mg by mouth at bedtime.    Marland Kitchen omeprazole (PRILOSEC) 20 MG capsule Take 20 mg by mouth daily.      No current facility-administered medications for this visit.     Allergies:   Codeine   Social History:  The patient  reports that she has quit smoking. She has never used smokeless tobacco. She reports that she drinks about 4.0 standard drinks of alcohol per week. She reports that she does not use drugs.   Family History:  The patient's family history includes Bone cancer in her mother; Diabetes in her paternal grandfather; Heart disease in her father; Thyroid disease in her sister; Thyroid nodules in her mother.    ROS:  Please see the history of present illness.   Otherwise, review of systems is positive for none.   All other  systems are reviewed and negative.    PHYSICAL EXAM: VS:  BP (!) 142/62   Pulse 61   Ht 5\' 7"  (1.702 m)   Wt 144 lb (65.3 kg)   SpO2 93%   BMI 22.55 kg/m  , BMI Body mass index is 22.55 kg/m. GEN: Well nourished, well developed, in no acute distress  HEENT: normal  Neck: no JVD, carotid bruits, or masses Cardiac: RRR; no murmurs, rubs, or gallops,no edema  Respiratory:  clear to auscultation bilaterally, normal work of breathing GI: soft, nontender, nondistended, + BS MS: no deformity or atrophy  Skin: warm and dry Neuro:  Strength and sensation are intact Psych: euthymic mood, full affect  EKG:  EKG is not ordered  today. Personal review of the ekg ordered 10/03/18 shows sinus rhythm, rate 68  Recent Labs: 09/25/2018: BUN 18; Creatinine, Ser 1.08; Hemoglobin 13.0; Platelets 199; Potassium 4.1; Sodium 142; TSH 0.752    Lipid Panel  No results found for: CHOL, TRIG, HDL, CHOLHDL, VLDL, LDLCALC, LDLDIRECT   Wt Readings from Last 3 Encounters:  10/07/18 144 lb (65.3 kg)  08/28/18 144 lb 8 oz (65.5 kg)  05/23/18 145 lb 8 oz (66 kg)      Other studies Reviewed: Additional studies/ records that were reviewed today include: TTE 06/13/18  Review of the above records today demonstrates:  - Left ventricle: The cavity size was normal. Wall thickness was   normal. Systolic function was vigorous. The estimated ejection   fraction was in the range of 65% to 70%. Wall motion was normal;   there were no regional wall motion abnormalities. Left   ventricular diastolic function parameters were normal. - Mitral valve: Mildly thickened leaflets . There was trivial   regurgitation. - Left atrium: The atrium was normal in size. - Atrial septum: No defect or patent foramen ovale was identified   by saline microbubble contrast. - Tricuspid valve: There was mild regurgitation. - Pulmonary arteries: PA peak pressure: 27 mm Hg (S). - Inferior vena cava: The vessel was normal in size. The   respirophasic diameter changes were in the normal range (>= 50%),   consistent with normal central venous pressure.   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  She did have carotid sinus massage that showed a 5-second pause.  This is in epic under media.  We have called her primary cardiologist to get records which were faxed over.  Her EKG is essentially normal with normal intervals.  Due to her 5-second pause with carotid sinus massage, she would qualify for pacemaker implant, though she would prefer to avoid it at this time.  [name] try to avoid further  inciting factors.  She is continued to be dizzy and has been is prescribed meclizine which she has not yet started.  He has had multiple somatic complaints in the past which is been helped by antidepressants.  This may help with some of her dizziness as well.  I Garland Smouse let her discuss this with her primary physician  Case discussed with primary cardiology  Current medicines are reviewed at length with the patient today.   The patient does not have concerns regarding her medicines.  The following changes were made today:  none  Labs/ tests ordered today include:  No orders of the defined types were placed in this encounter.    Disposition:   FU with Kristen Figueroa 3 months  Signed, Kristen Figueroa [employer],  MD  10/07/2018 4:38 PM     [facility] [address] [place] [id number] [phone] (office) [phone] (fax)

## 2018-10-15 ENCOUNTER — Institutional Professional Consult (permissible substitution): Payer: Medicare Other | Admitting: Cardiology

## 2018-10-16 DIAGNOSIS — I495 Sick sinus syndrome: Secondary | ICD-10-CM | POA: Diagnosis not present

## 2018-10-16 DIAGNOSIS — F419 Anxiety disorder, unspecified: Secondary | ICD-10-CM | POA: Diagnosis not present

## 2018-10-16 DIAGNOSIS — R55 Syncope and collapse: Secondary | ICD-10-CM | POA: Diagnosis not present

## 2018-10-16 DIAGNOSIS — R42 Dizziness and giddiness: Secondary | ICD-10-CM | POA: Diagnosis not present

## 2018-10-25 DIAGNOSIS — K5909 Other constipation: Secondary | ICD-10-CM | POA: Diagnosis not present

## 2018-10-25 DIAGNOSIS — N3281 Overactive bladder: Secondary | ICD-10-CM | POA: Diagnosis not present

## 2018-10-25 DIAGNOSIS — G9001 Carotid sinus syncope: Secondary | ICD-10-CM | POA: Diagnosis not present

## 2018-10-25 DIAGNOSIS — F411 Generalized anxiety disorder: Secondary | ICD-10-CM | POA: Diagnosis not present

## 2018-10-29 ENCOUNTER — Ambulatory Visit
Admission: RE | Admit: 2018-10-29 | Discharge: 2018-10-29 | Disposition: A | Discharge status: Home / Self Care | Payer: Medicare Other | Source: Ambulatory Visit | Attending: Internal Medicine | Admitting: Internal Medicine

## 2018-10-29 ENCOUNTER — Other Ambulatory Visit: Payer: Self-pay | Admitting: General Practice

## 2018-10-29 ENCOUNTER — Other Ambulatory Visit: Payer: Self-pay | Admitting: Internal Medicine

## 2018-10-29 DIAGNOSIS — Z1231 Encounter for screening mammogram for malignant neoplasm of breast: Secondary | ICD-10-CM

## 2018-10-29 IMAGING — MG DIGITAL SCREENING BILATERAL MAMMOGRAM WITH TOMO AND CAD
8 series · 8 of 24 positions shown · non-contrast
Comparison: Previous exam(s).

CLINICAL DATA: Screening.

EXAM:
DIGITAL SCREENING BILATERAL MAMMOGRAM WITH TOMO AND CAD

[R CC synth-2D]
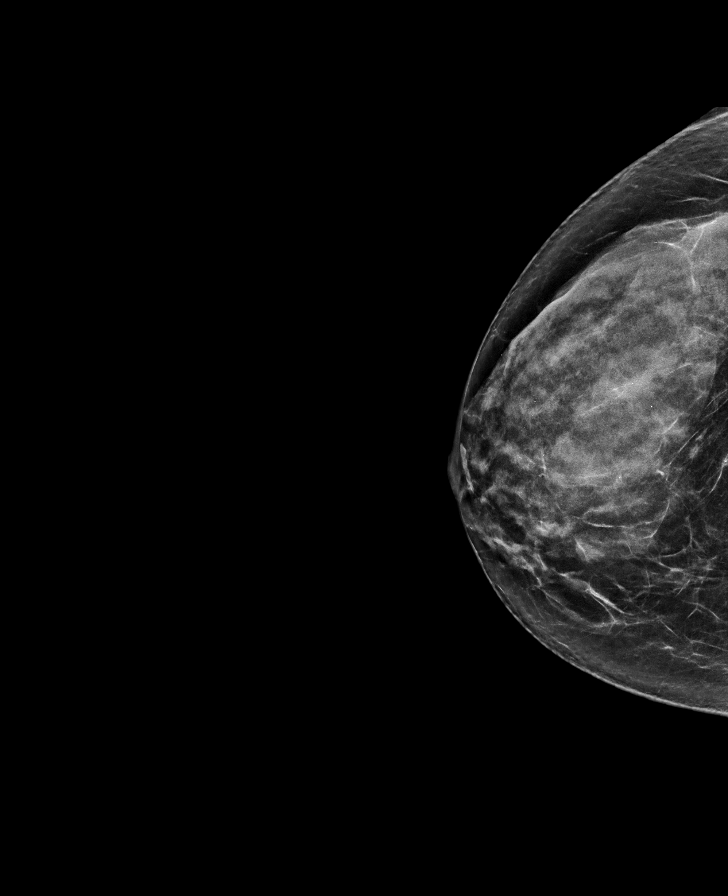

[L CC synth-2D]
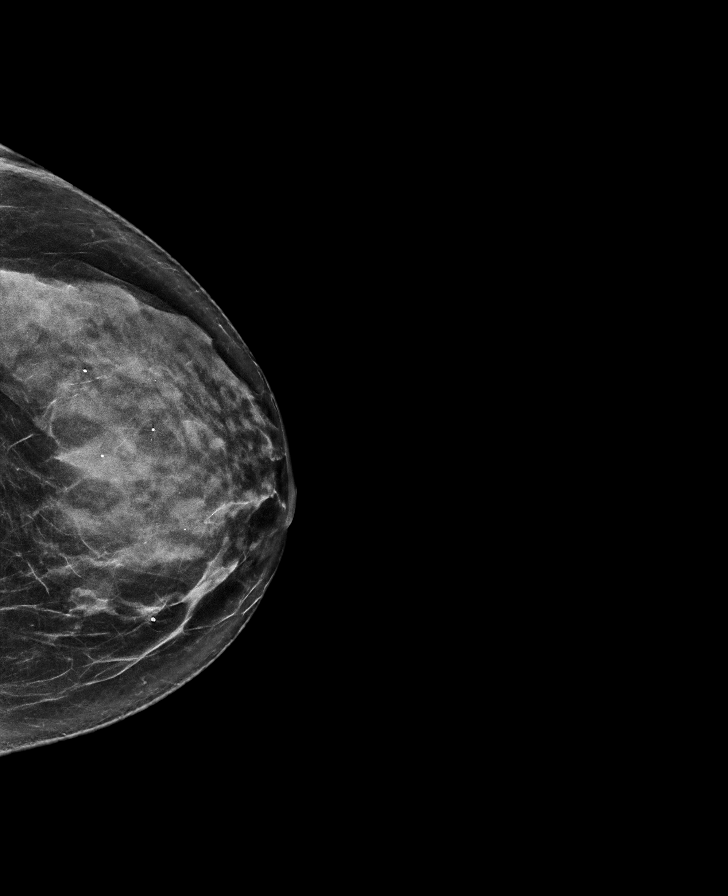

[R MLO synth-2D]
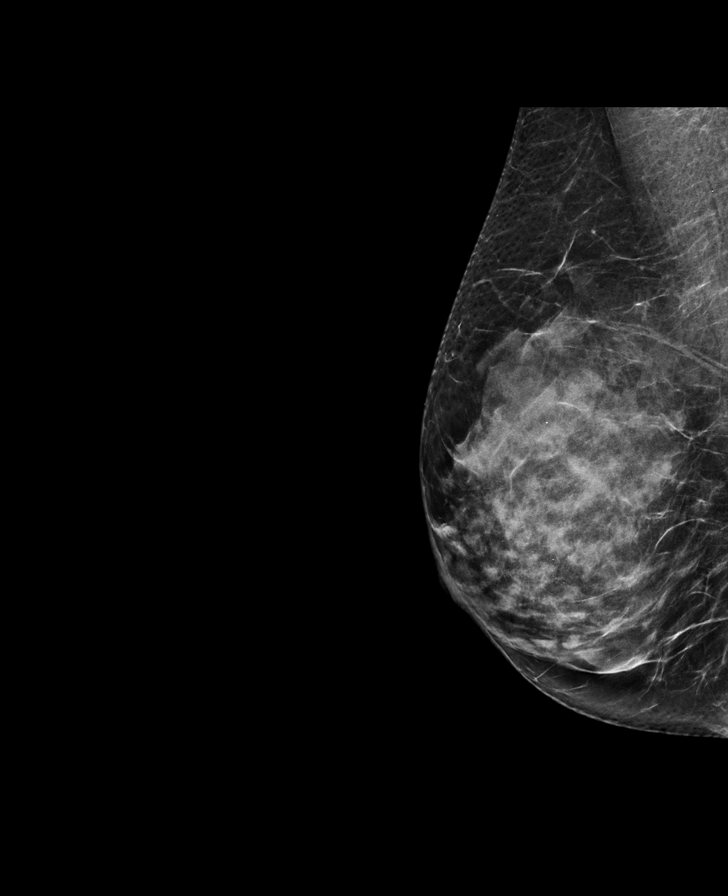

[L MLO synth-2D]
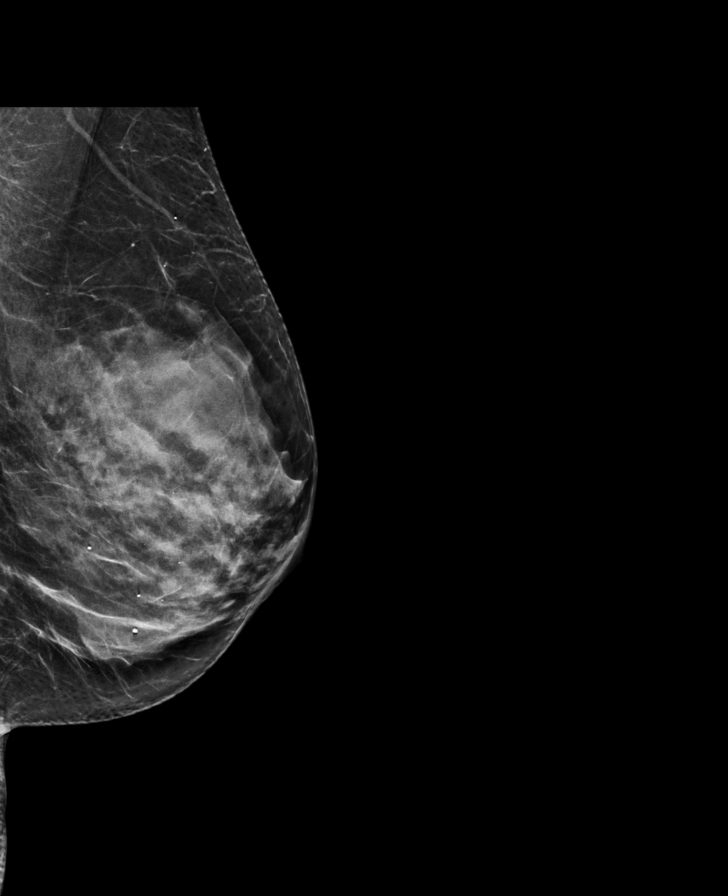

[L CC tomo · tomo slice 35/69.0]
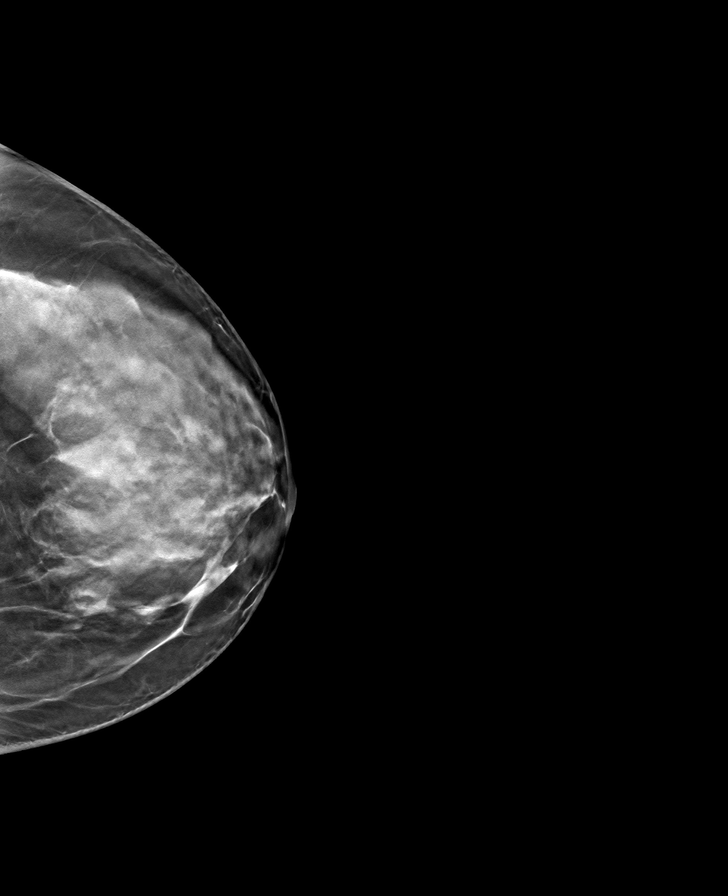

[R MLO tomo · tomo slice 33/64.0]
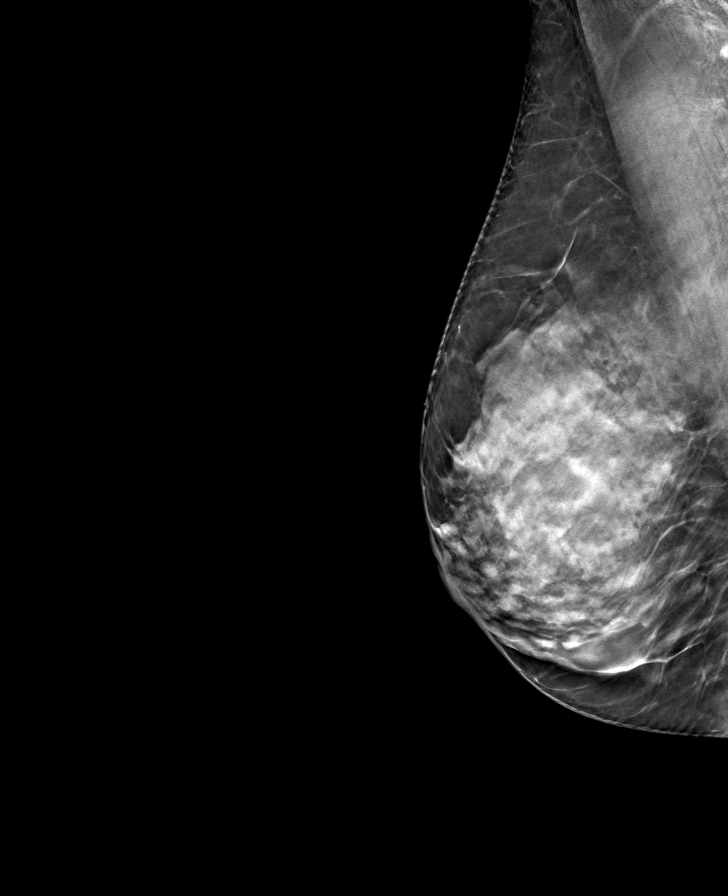

[L MLO tomo · tomo slice 32/63.0]
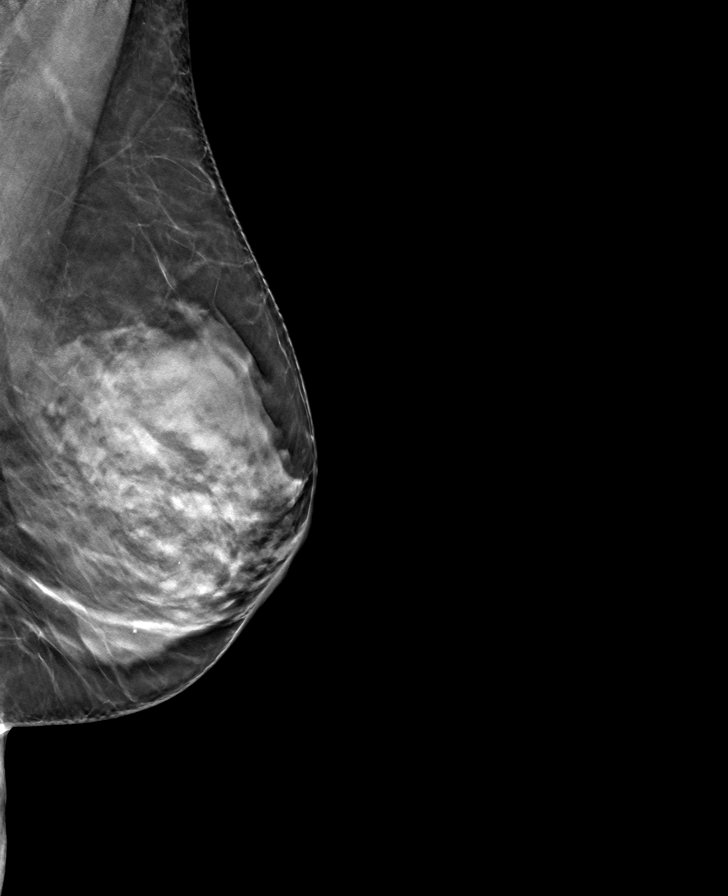

[R CC tomo · tomo slice 33/65.0]
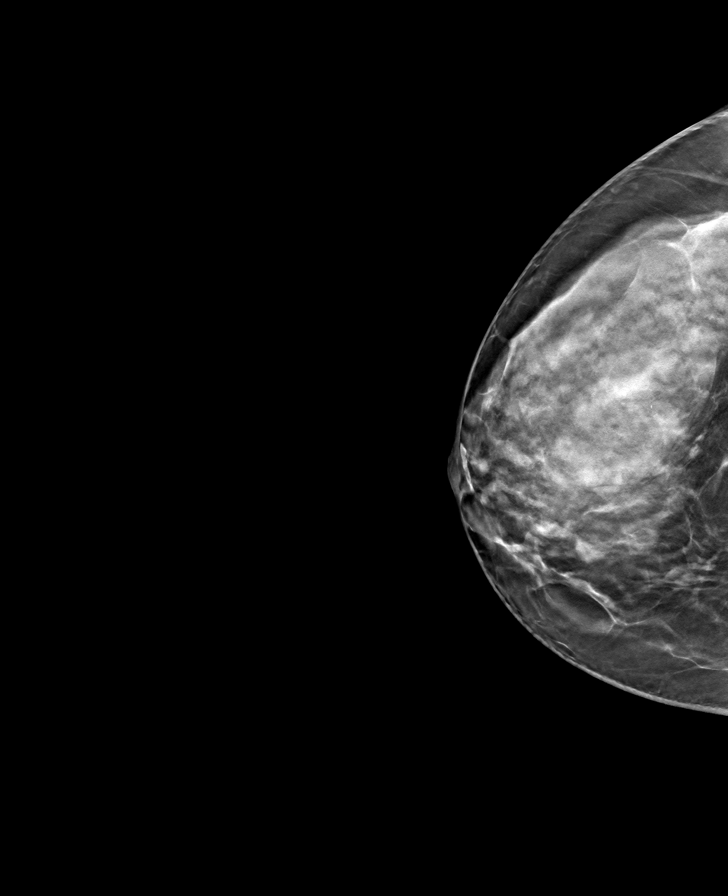

[8 of 24 positions shown; findings below may reference images not displayed]

ACR Breast Density Category d: The breast tissue is extremely dense,
which lowers the sensitivity of mammography
FINDINGS: There are no findings suspicious for malignancy. Images were
processed with CAD.
IMPRESSION: No mammographic evidence of malignancy. A result letter of this
screening mammogram will be mailed directly to the patient.

RECOMMENDATION:
Screening mammogram in one year. (Code:[5I])

BI-RADS CATEGORY  1: Negative.

## 2018-11-25 DIAGNOSIS — I495 Sick sinus syndrome: Secondary | ICD-10-CM | POA: Diagnosis not present

## 2018-12-04 DIAGNOSIS — H40003 Preglaucoma, unspecified, bilateral: Secondary | ICD-10-CM | POA: Diagnosis not present

## 2018-12-04 DIAGNOSIS — H40033 Anatomical narrow angle, bilateral: Secondary | ICD-10-CM | POA: Diagnosis not present

## 2018-12-04 DIAGNOSIS — H2513 Age-related nuclear cataract, bilateral: Secondary | ICD-10-CM | POA: Diagnosis not present

## 2018-12-24 DIAGNOSIS — Z0120 Encounter for dental examination and cleaning without abnormal findings: Secondary | ICD-10-CM | POA: Diagnosis not present

## 2019-01-01 ENCOUNTER — Encounter: Payer: Self-pay | Admitting: *Deleted

## 2019-01-07 ENCOUNTER — Encounter (INDEPENDENT_AMBULATORY_CARE_PROVIDER_SITE_OTHER): Payer: Self-pay

## 2019-01-07 ENCOUNTER — Encounter: Payer: Self-pay | Admitting: Cardiology

## 2019-01-07 ENCOUNTER — Ambulatory Visit: Payer: Medicare Other | Admitting: Cardiology

## 2019-01-07 VITALS — BP 122/64 | HR 78 | Ht 67.0 in | Wt 146.0 lb

## 2019-01-07 DIAGNOSIS — G9001 Carotid sinus syncope: Secondary | ICD-10-CM

## 2019-01-07 NOTE — Patient Instructions (Signed)
Medication Instructions:  Your physician recommends that you continue on your current medications as directed. Please refer to the Current Medication list given to you today.  * If you need a refill on your cardiac medications before your next appointment, please call your pharmacy.   Labwork: None ordered  Testing/Procedures: None ordered  Follow-Up: Your physician wants you to follow-up in: 6 month with Dr. Camnitz.  You will receive a reminder letter in the mail two months in advance. If you don't receive a letter, please call our office to schedule the follow-up appointment.   Thank you for choosing [facility]!!   Oluwafemi Villella, RN [phone]        

## 2019-01-07 NOTE — Progress Notes (Signed)
Electrophysiology Office Note   Date:  01/07/2019   ID:  Kristen, Figueroa Jul 01, 1946, MRN [id number]  PCP:  Gaspar Garbe, MD  Cardiologist:  Jacinto Halim Primary Electrophysiologist:  Regan Lemming, MD    No chief complaint on file.    History of Present Illness: Kristen Figueroa is a 73 y.o. female who is being seen today for the evaluation of near syncope at the request of Pearson Grippe, MD. Presenting today for electrophysiology evaluation.  He has a history of hypothyroidism, hyperlipidemia.  She was having recurrent near syncopal episodes and was admitted for telemetry monitoring.  Her episodes occur with certain movements such as Pilates and turning her head.  On 11/13 and 11/14 in the hospital, she had near syncopal episodes which were reproducible with carotid sinus massage.  She had documented sinus pauses with near syncopal symptoms that lasted for 5.2 seconds.  She felt dizzy for the rest of the day after that incident.  She then followed back up in cardiology clinic complaining of dizziness.  She said that she had a room spinning.  At times she feels like the room is moving sideways.  She was noted to not have any rhythm abnormalities at that time.  Today, denies symptoms of palpitations, chest pain, shortness of breath, orthopnea, PND, lower extremity edema, claudication, dizziness, presyncope, syncope, bleeding, or neurologic sequela. The patient is tolerating medications without difficulties.  Overall she is felt well.  She has had no chest pain or shortness of breath.  She has had no further episodes of syncope.  She has had a few times where she gets dizzy when bending over and standing back up.  She has had no further feelings like she had previously.  She is done well since last being seen.   Past Medical History:  Diagnosis Date  . Allergy    RHINITIS  . Arthritis   . Bradycardia   . Dyslipidemia   . Headache   . Hypothyroidism   . Vision abnormalities    Past  Surgical History:  Procedure Laterality Date  . HIP ARTHROPLASTY Left   . left hip replacement    . NECK LIFT    . TUBAL LIGATION    . TUBAL LIGATION    . UPPER GASTROINTESTINAL ENDOSCOPY  09/07/2010     Current Outpatient Medications  Medication Sig Dispense Refill  . acetaminophen (TYLENOL) 500 MG tablet Take 500 mg by mouth every 6 (six) hours as needed for mild pain.    Marland Kitchen ALPRAZolam (XANAX) 0.25 MG tablet TAKE AS DIRECTED (Patient taking differently: Take 0.25 mg by mouth at bedtime. ) 15 tablet 3  . amoxicillin (AMOXIL) 500 MG capsule Take 2,000 mg by mouth See admin instructions. Take 1 hour prior to dental appointments  2  . Cholecalciferol (VITAMIN D3) 125 MCG (5000 UT) CAPS Take 5,000 Units by mouth daily.    Marland Kitchen levothyroxine (SYNTHROID, LEVOTHROID) 50 MCG tablet Take 50 mcg by mouth daily.    . Magnesium 400 MG CAPS Take 400 mg by mouth at bedtime.    Marland Kitchen omeprazole (PRILOSEC) 20 MG capsule Take 20 mg by mouth daily.      No current facility-administered medications for this visit.     Allergies:   Codeine   Social History:  The patient  reports that she has quit smoking. She has never used smokeless tobacco. She reports current alcohol use of about 4.0 standard drinks of alcohol per week. She reports that she does  not use drugs.   Family History:  The patient's family history includes Bone cancer in her mother; Diabetes in her paternal grandfather; Glaucoma in her maternal grandfather; Heart disease in her father; Hypertension in her father; Thyroid disease in her nephew, niece, sister, and another family member; Thyroid nodules in her mother.    ROS:  Please see the history of present illness.   Otherwise, review of systems is positive for none.   All other systems are reviewed and negative.   PHYSICAL EXAM: VS:  BP 122/64   Pulse 78   Ht 5\' 7"  (1.702 m)   Wt 146 lb (66.2 kg)   SpO2 98%   BMI 22.87 kg/m  , BMI Body mass index is 22.87 kg/m. GEN: Well nourished, well  developed, in no acute distress  HEENT: normal  Neck: no JVD, carotid bruits, or masses Cardiac: RRR; no murmurs, rubs, or gallops,no edema  Respiratory:  clear to auscultation bilaterally, normal work of breathing GI: soft, nontender, nondistended, + BS MS: no deformity or atrophy  Skin: warm and dry Neuro:  Strength and sensation are intact Psych: euthymic mood, full affect  EKG:  EKG is ordered today. Personal review of the ekg ordered 10/03/18 shows SR, rate 68   Recent Labs: 09/25/2018: BUN 18; Creatinine, Ser 1.08; Hemoglobin 13.0; Platelets 199; Potassium 4.1; Sodium 142; TSH 0.752    Lipid Panel  No results found for: CHOL, TRIG, HDL, CHOLHDL, VLDL, LDLCALC, LDLDIRECT   Wt Readings from Last 3 Encounters:  01/07/19 146 lb (66.2 kg)  10/07/18 144 lb (65.3 kg)  08/28/18 144 lb 8 oz (65.5 kg)      Other studies Reviewed: Additional studies/ records that were reviewed today include: TTE 06/13/18  Review of the above records today demonstrates:  - Left ventricle: The cavity size was normal. Wall thickness was   normal. Systolic function was vigorous. The estimated ejection   fraction was in the range of 65% to 70%. Wall motion was normal;   there were no regional wall motion abnormalities. Left   ventricular diastolic function parameters were normal. - Mitral valve: Mildly thickened leaflets . There was trivial   regurgitation. - Left atrium: The atrium was normal in size. - Atrial septum: No defect or patent foramen ovale was identified   by saline microbubble contrast. - Tricuspid valve: There was mild regurgitation. - Pulmonary arteries: PA peak pressure: 27 mm Hg (S). - Inferior vena cava: The vessel was normal in size. The   respirophasic diameter changes were in the normal range (>= 50%),   consistent with normal central venous pressure.   ASSESSMENT AND PLAN:  1.  Carotid sinus hypersensitivity: This is potentially the cause of her dizziness and near  syncope.  Fortunately she has had no further episodes since last being seen.  She is felt well without any major symptoms other than a little dizziness when bending over and standing back up.  Due to that, no indication for pacemaker at this time.  I  see her back in 6 months with further discussions.  I did tell her that if she has any more near syncopal episodes to call us back and we would discuss pacemaker implantation at that time.     Current medicines are reviewed at length with the patient today.   The patient does not have concerns regarding her medicines.  The following changes were made today: None  Labs/ tests ordered today include:  No orders of the defined types were  placed in this encounter.    Disposition:   FU with   6 months  Signed,  Jorja Loa, MD  01/07/2019 2:12 PM     [facility] [address] [place] [address] [phone] (office) [phone] (fax)

## 2019-01-08 ENCOUNTER — Other Ambulatory Visit: Payer: Self-pay | Admitting: Cardiology

## 2019-01-16 ENCOUNTER — Ambulatory Visit: Payer: Self-pay | Admitting: Cardiology

## 2019-01-30 DIAGNOSIS — L821 Other seborrheic keratosis: Secondary | ICD-10-CM | POA: Diagnosis not present

## 2019-01-30 DIAGNOSIS — L820 Inflamed seborrheic keratosis: Secondary | ICD-10-CM | POA: Diagnosis not present

## 2019-03-05 ENCOUNTER — Ambulatory Visit: Payer: Self-pay | Admitting: Cardiology

## 2019-03-25 DIAGNOSIS — H40032 Anatomical narrow angle, left eye: Secondary | ICD-10-CM | POA: Diagnosis not present

## 2019-03-25 DIAGNOSIS — H2513 Age-related nuclear cataract, bilateral: Secondary | ICD-10-CM | POA: Diagnosis not present

## 2019-03-25 DIAGNOSIS — H40003 Preglaucoma, unspecified, bilateral: Secondary | ICD-10-CM | POA: Diagnosis not present

## 2019-03-25 DIAGNOSIS — H40033 Anatomical narrow angle, bilateral: Secondary | ICD-10-CM | POA: Diagnosis not present

## 2019-03-25 DIAGNOSIS — Z736 Limitation of activities due to disability: Secondary | ICD-10-CM | POA: Diagnosis not present

## 2019-03-25 DIAGNOSIS — H40031 Anatomical narrow angle, right eye: Secondary | ICD-10-CM | POA: Diagnosis not present

## 2019-03-25 DIAGNOSIS — H538 Other visual disturbances: Secondary | ICD-10-CM | POA: Diagnosis not present

## 2019-04-18 DIAGNOSIS — Z1159 Encounter for screening for other viral diseases: Secondary | ICD-10-CM | POA: Diagnosis not present

## 2019-04-18 DIAGNOSIS — H2513 Age-related nuclear cataract, bilateral: Secondary | ICD-10-CM | POA: Diagnosis not present

## 2019-04-18 DIAGNOSIS — Z01812 Encounter for preprocedural laboratory examination: Secondary | ICD-10-CM | POA: Diagnosis not present

## 2019-04-18 DIAGNOSIS — Z01818 Encounter for other preprocedural examination: Secondary | ICD-10-CM | POA: Diagnosis not present

## 2019-04-21 DIAGNOSIS — H2513 Age-related nuclear cataract, bilateral: Secondary | ICD-10-CM | POA: Diagnosis not present

## 2019-04-24 DIAGNOSIS — H2511 Age-related nuclear cataract, right eye: Secondary | ICD-10-CM | POA: Diagnosis not present

## 2019-04-25 DIAGNOSIS — Z736 Limitation of activities due to disability: Secondary | ICD-10-CM | POA: Diagnosis not present

## 2019-04-25 DIAGNOSIS — Z973 Presence of spectacles and contact lenses: Secondary | ICD-10-CM | POA: Diagnosis not present

## 2019-04-25 DIAGNOSIS — H40039 Anatomical narrow angle, unspecified eye: Secondary | ICD-10-CM | POA: Diagnosis not present

## 2019-04-25 DIAGNOSIS — Z48810 Encounter for surgical aftercare following surgery on the sense organs: Secondary | ICD-10-CM | POA: Diagnosis not present

## 2019-04-25 DIAGNOSIS — Z961 Presence of intraocular lens: Secondary | ICD-10-CM | POA: Diagnosis not present

## 2019-04-25 DIAGNOSIS — H2512 Age-related nuclear cataract, left eye: Secondary | ICD-10-CM | POA: Diagnosis not present

## 2019-04-26 ENCOUNTER — Other Ambulatory Visit: Payer: Self-pay | Admitting: Cardiology

## 2019-04-30 DIAGNOSIS — Z48810 Encounter for surgical aftercare following surgery on the sense organs: Secondary | ICD-10-CM | POA: Diagnosis not present

## 2019-04-30 DIAGNOSIS — H40039 Anatomical narrow angle, unspecified eye: Secondary | ICD-10-CM | POA: Diagnosis not present

## 2019-04-30 DIAGNOSIS — H2512 Age-related nuclear cataract, left eye: Secondary | ICD-10-CM | POA: Diagnosis not present

## 2019-04-30 DIAGNOSIS — Z961 Presence of intraocular lens: Secondary | ICD-10-CM | POA: Diagnosis not present

## 2019-05-01 DIAGNOSIS — E7800 Pure hypercholesterolemia, unspecified: Secondary | ICD-10-CM | POA: Diagnosis not present

## 2019-05-01 DIAGNOSIS — E038 Other specified hypothyroidism: Secondary | ICD-10-CM | POA: Diagnosis not present

## 2019-05-01 DIAGNOSIS — R82998 Other abnormal findings in urine: Secondary | ICD-10-CM | POA: Diagnosis not present

## 2019-05-05 ENCOUNTER — Ambulatory Visit: Payer: Self-pay | Admitting: Cardiology

## 2019-05-13 DIAGNOSIS — N3281 Overactive bladder: Secondary | ICD-10-CM | POA: Diagnosis not present

## 2019-05-13 DIAGNOSIS — G9001 Carotid sinus syncope: Secondary | ICD-10-CM | POA: Diagnosis not present

## 2019-05-13 DIAGNOSIS — Z0000 Encounter for general adult medical examination without abnormal findings: Secondary | ICD-10-CM | POA: Diagnosis not present

## 2019-05-13 DIAGNOSIS — E7800 Pure hypercholesterolemia, unspecified: Secondary | ICD-10-CM | POA: Diagnosis not present

## 2019-05-27 DIAGNOSIS — Z471 Aftercare following joint replacement surgery: Secondary | ICD-10-CM | POA: Diagnosis not present

## 2019-05-27 DIAGNOSIS — Z96642 Presence of left artificial hip joint: Secondary | ICD-10-CM | POA: Diagnosis not present

## 2019-05-27 DIAGNOSIS — M545 Low back pain: Secondary | ICD-10-CM | POA: Diagnosis not present

## 2019-06-04 DIAGNOSIS — Z01812 Encounter for preprocedural laboratory examination: Secondary | ICD-10-CM | POA: Diagnosis not present

## 2019-06-04 DIAGNOSIS — Z1159 Encounter for screening for other viral diseases: Secondary | ICD-10-CM | POA: Diagnosis not present

## 2019-06-04 DIAGNOSIS — H2512 Age-related nuclear cataract, left eye: Secondary | ICD-10-CM | POA: Diagnosis not present

## 2019-06-04 DIAGNOSIS — Z20828 Contact with and (suspected) exposure to other viral communicable diseases: Secondary | ICD-10-CM | POA: Diagnosis not present

## 2019-06-11 DIAGNOSIS — Z87891 Personal history of nicotine dependence: Secondary | ICD-10-CM | POA: Diagnosis not present

## 2019-06-11 DIAGNOSIS — G9001 Carotid sinus syncope: Secondary | ICD-10-CM | POA: Diagnosis not present

## 2019-06-11 DIAGNOSIS — Z9841 Cataract extraction status, right eye: Secondary | ICD-10-CM | POA: Diagnosis not present

## 2019-06-11 DIAGNOSIS — Z961 Presence of intraocular lens: Secondary | ICD-10-CM | POA: Diagnosis not present

## 2019-06-11 DIAGNOSIS — K219 Gastro-esophageal reflux disease without esophagitis: Secondary | ICD-10-CM | POA: Diagnosis not present

## 2019-06-11 DIAGNOSIS — H2512 Age-related nuclear cataract, left eye: Secondary | ICD-10-CM | POA: Diagnosis not present

## 2019-06-11 DIAGNOSIS — Z79899 Other long term (current) drug therapy: Secondary | ICD-10-CM | POA: Diagnosis not present

## 2019-06-12 DIAGNOSIS — Z961 Presence of intraocular lens: Secondary | ICD-10-CM | POA: Diagnosis not present

## 2019-06-12 DIAGNOSIS — Z48810 Encounter for surgical aftercare following surgery on the sense organs: Secondary | ICD-10-CM | POA: Diagnosis not present

## 2019-06-12 DIAGNOSIS — H40009 Preglaucoma, unspecified, unspecified eye: Secondary | ICD-10-CM | POA: Diagnosis not present

## 2019-06-19 DIAGNOSIS — Z961 Presence of intraocular lens: Secondary | ICD-10-CM | POA: Diagnosis not present

## 2019-06-19 DIAGNOSIS — H40009 Preglaucoma, unspecified, unspecified eye: Secondary | ICD-10-CM | POA: Diagnosis not present

## 2019-06-19 DIAGNOSIS — Z48810 Encounter for surgical aftercare following surgery on the sense organs: Secondary | ICD-10-CM | POA: Diagnosis not present

## 2019-06-26 DIAGNOSIS — Z96642 Presence of left artificial hip joint: Secondary | ICD-10-CM | POA: Diagnosis not present

## 2019-06-26 DIAGNOSIS — M545 Low back pain: Secondary | ICD-10-CM | POA: Diagnosis not present

## 2019-07-04 DIAGNOSIS — M545 Low back pain: Secondary | ICD-10-CM | POA: Diagnosis not present

## 2019-07-28 DIAGNOSIS — M9903 Segmental and somatic dysfunction of lumbar region: Secondary | ICD-10-CM | POA: Diagnosis not present

## 2019-07-28 DIAGNOSIS — M5441 Lumbago with sciatica, right side: Secondary | ICD-10-CM | POA: Diagnosis not present

## 2019-07-29 DIAGNOSIS — M5441 Lumbago with sciatica, right side: Secondary | ICD-10-CM | POA: Diagnosis not present

## 2019-07-29 DIAGNOSIS — M9903 Segmental and somatic dysfunction of lumbar region: Secondary | ICD-10-CM | POA: Diagnosis not present

## 2019-07-30 DIAGNOSIS — M5441 Lumbago with sciatica, right side: Secondary | ICD-10-CM | POA: Diagnosis not present

## 2019-07-30 DIAGNOSIS — M9903 Segmental and somatic dysfunction of lumbar region: Secondary | ICD-10-CM | POA: Diagnosis not present

## 2019-07-31 DIAGNOSIS — Z48810 Encounter for surgical aftercare following surgery on the sense organs: Secondary | ICD-10-CM | POA: Diagnosis not present

## 2019-07-31 DIAGNOSIS — Z961 Presence of intraocular lens: Secondary | ICD-10-CM | POA: Diagnosis not present

## 2019-08-01 DIAGNOSIS — M5136 Other intervertebral disc degeneration, lumbar region: Secondary | ICD-10-CM | POA: Diagnosis not present

## 2019-08-04 DIAGNOSIS — M5441 Lumbago with sciatica, right side: Secondary | ICD-10-CM | POA: Diagnosis not present

## 2019-08-04 DIAGNOSIS — M9903 Segmental and somatic dysfunction of lumbar region: Secondary | ICD-10-CM | POA: Diagnosis not present

## 2019-08-05 DIAGNOSIS — M5441 Lumbago with sciatica, right side: Secondary | ICD-10-CM | POA: Diagnosis not present

## 2019-08-05 DIAGNOSIS — M9903 Segmental and somatic dysfunction of lumbar region: Secondary | ICD-10-CM | POA: Diagnosis not present

## 2019-08-06 DIAGNOSIS — M5441 Lumbago with sciatica, right side: Secondary | ICD-10-CM | POA: Diagnosis not present

## 2019-08-06 DIAGNOSIS — M9903 Segmental and somatic dysfunction of lumbar region: Secondary | ICD-10-CM | POA: Diagnosis not present

## 2019-08-12 DIAGNOSIS — M9903 Segmental and somatic dysfunction of lumbar region: Secondary | ICD-10-CM | POA: Diagnosis not present

## 2019-08-12 DIAGNOSIS — M5441 Lumbago with sciatica, right side: Secondary | ICD-10-CM | POA: Diagnosis not present

## 2019-08-13 DIAGNOSIS — M5441 Lumbago with sciatica, right side: Secondary | ICD-10-CM | POA: Diagnosis not present

## 2019-08-13 DIAGNOSIS — M9903 Segmental and somatic dysfunction of lumbar region: Secondary | ICD-10-CM | POA: Diagnosis not present

## 2019-08-18 DIAGNOSIS — M5441 Lumbago with sciatica, right side: Secondary | ICD-10-CM | POA: Diagnosis not present

## 2019-08-18 DIAGNOSIS — M9903 Segmental and somatic dysfunction of lumbar region: Secondary | ICD-10-CM | POA: Diagnosis not present

## 2019-08-20 DIAGNOSIS — M9903 Segmental and somatic dysfunction of lumbar region: Secondary | ICD-10-CM | POA: Diagnosis not present

## 2019-08-20 DIAGNOSIS — M5441 Lumbago with sciatica, right side: Secondary | ICD-10-CM | POA: Diagnosis not present

## 2019-08-25 DIAGNOSIS — M5441 Lumbago with sciatica, right side: Secondary | ICD-10-CM | POA: Diagnosis not present

## 2019-08-25 DIAGNOSIS — M9903 Segmental and somatic dysfunction of lumbar region: Secondary | ICD-10-CM | POA: Diagnosis not present

## 2019-08-29 DIAGNOSIS — Z23 Encounter for immunization: Secondary | ICD-10-CM | POA: Diagnosis not present

## 2019-09-01 DIAGNOSIS — M9903 Segmental and somatic dysfunction of lumbar region: Secondary | ICD-10-CM | POA: Diagnosis not present

## 2019-09-01 DIAGNOSIS — M5441 Lumbago with sciatica, right side: Secondary | ICD-10-CM | POA: Diagnosis not present

## 2019-09-16 DIAGNOSIS — L720 Epidermal cyst: Secondary | ICD-10-CM | POA: Diagnosis not present

## 2019-09-16 DIAGNOSIS — Z85828 Personal history of other malignant neoplasm of skin: Secondary | ICD-10-CM | POA: Diagnosis not present

## 2019-09-16 DIAGNOSIS — L821 Other seborrheic keratosis: Secondary | ICD-10-CM | POA: Diagnosis not present

## 2019-09-16 DIAGNOSIS — L812 Freckles: Secondary | ICD-10-CM | POA: Diagnosis not present

## 2019-10-08 ENCOUNTER — Other Ambulatory Visit: Payer: Self-pay | Admitting: Internal Medicine

## 2019-10-08 DIAGNOSIS — Z1231 Encounter for screening mammogram for malignant neoplasm of breast: Secondary | ICD-10-CM

## 2019-10-31 ENCOUNTER — Other Ambulatory Visit: Payer: Self-pay

## 2019-10-31 MED ORDER — ESCITALOPRAM OXALATE 10 MG PO TABS: 10.0000 mg | ORAL_TABLET | Freq: Every day | ORAL | 1 refills | Status: DC

## 2019-11-29 ENCOUNTER — Emergency Department: Admit: 2019-11-30 | Payer: MEDICARE

## 2019-11-29 ENCOUNTER — Inpatient Hospital Stay: Admit: 2019-11-29 | Discharge: 2019-11-30 | Disposition: A | Discharge status: Home / Self Care | Payer: MEDICARE

## 2019-11-29 DIAGNOSIS — R42 Dizziness and giddiness: Secondary | ICD-10-CM

## 2019-11-29 MED ORDER — meclizine (ANTIVERT) tablet 25 mg
25 | Freq: Once | ORAL | Status: AC
Start: 2019-11-29 — End: 2019-11-29
  Administered 2019-11-30: 01:00:00 25 mg via ORAL

## 2019-11-29 MED ORDER — meclizine (ANTIVERT) 25 mg tablet
25 | ORAL_TABLET | Freq: Three times a day (TID) | ORAL | 0 refills | Status: AC | PRN
Start: 2019-11-29 — End: ?

## 2019-11-29 MED ORDER — sodium chloride 0.9 % infusion
INTRAVENOUS | Status: AC
Start: 2019-11-29 — End: 2019-11-29
  Administered 2019-11-30: 02:00:00 50

## 2019-11-29 MED ORDER — ondansetron (ZOFRAN ODT) 4 MG disintegrating tablet
4 | ORAL_TABLET | ORAL | 0 refills | Status: AC
Start: 2019-11-29 — End: ?

## 2019-11-29 MED ORDER — ondansetron (ZOFRAN) 4 mg/2 mL injection
4 | INTRAMUSCULAR | Status: AC
Start: 2019-11-29 — End: 2019-11-29

## 2019-11-29 MED ORDER — ondansetron (ZOFRAN) injection 4 mg
4 | Freq: Once | INTRAMUSCULAR | Status: AC
Start: 2019-11-29 — End: 2019-11-29
  Administered 2019-11-30: 03:00:00 4 mg via INTRAVENOUS

## 2019-11-29 MED ORDER — OMNIPAQUE (iohexol) 350 mg iodine/mL 100 mL
350 | Freq: Once | INTRAVENOUS | Status: AC | PRN
Start: 2019-11-29 — End: 2019-11-29
  Administered 2019-11-30: 02:00:00 100 mL via INTRAVENOUS

## 2019-11-29 MED ORDER — sodium chloride 0.9 % 500 mL IV fluid
Freq: Once | INTRAVENOUS | Status: AC
Start: 2019-11-29 — End: 2019-11-29
  Administered 2019-11-30: 01:00:00 500 mL via INTRAVENOUS

## 2019-11-29 MED ORDER — sodium chloride 0.9 % infusion
INTRAVENOUS | Status: AC
Start: 2019-11-29 — End: 2019-11-30

## 2019-11-29 MED FILL — MECLIZINE 25 MG TABLET: 25 25 mg | ORAL | Qty: 1

## 2019-11-29 MED FILL — ONDANSETRON HCL (PF) 4 MG/2 ML INJECTION SOLUTION: 4 4 mg/2 mL | INTRAMUSCULAR | Qty: 2

## 2019-11-29 MED FILL — SODIUM CHLORIDE 0.9 % INTRAVENOUS SOLUTION: 500.00 500.00 mL | INTRAVENOUS | Qty: 500

## 2019-11-29 MED FILL — SODIUM CHLORIDE 0.9 % INTRAVENOUS SOLUTION: INTRAVENOUS | Qty: 50

## 2019-11-29 NOTE — Unmapped (Signed)
Pt ambulate to bathroom with assistance. Return to room. Urine sample obtained and sent to lab

## 2019-11-29 NOTE — ED Notes (Signed)
Pt discharged to home. Pt verbalized understanding of discharge instructions. All questions answered. IV removed. Catheter intact. Direct pressure applied until hemostasis.  Ambulated from room with steady gait.

## 2019-11-29 NOTE — Unmapped (Signed)
Pt to imaging and return to room

## 2019-11-29 NOTE — Unmapped (Signed)
Your test results showed findings suspicious for Covid.  Your Covid test will result in 2 days, you will contacted only if it is positive and will be given appropriate guidelines at that time.  You can access your result in MyChart.  Drink 2-3 liters of water a day, take over the counter Tylenol/ibuprofen as directed for pain and/or fever over 100.5.  Quarantine from others until your test result is negative (not detected).

## 2019-11-29 NOTE — Unmapped (Signed)
ED Attending Attestation Note    Date of service:  11/29/2019    This patient was seen by the advanced practice provider.  I have seen and examined the patient, agree with the workup, evaluation, management and diagnosis. The care plan has been discussed.  I have reviewed the ECG and concur with the advanced practice provider's interpretation.    My assessment reveals a 74 y.o. female present with dizziness.  She reports that she feels that the room is spinning around her.  Reports it started yesterday.  Reports she is nauseated with it.  She feels as if she will fall down but has not fallen.  She denies weakness in her upper and lower extremities.  She has no tingling or changes in sensation.  She has no fevers or chills.  She has not vomited.  She is no abdominal pain or chest pain.    On exam, the patient is awake, alert, oriented ??3.  She is cranial nerves II through XII intact.  X-ray of the movements are full.  Pupils are equal, round, react to light.  She has no anterior chest wall tenderness to palpation.  She has a normal rate and regular rhythm on cardiovascular exam without murmurs, gallops, or rubs.  Chest clear pulmonary exam bilaterally to auscultation.  Her abdomen is soft and nontender to palpation.

## 2019-11-29 NOTE — Unmapped (Signed)
[facility] ED Note    Date of service:  11/29/2019    Reason for Visit: Dizziness and Nausea      Patient History     HPI This is a 74 year old female with a history of GERD, hyperlipidemia and hypothyroidism, who presents to the ED with complaints of a 24-hour history of dizziness, describes vertiginous symptoms similar to an episode she had 2 years ago.  She reports associated nausea with dry heaving, decreased intake.  Has otherwise been well without fever, chills, cough or other URI-type symptoms.  No abdominal pain or changes in urinary or bowel habits.  No chest pain or shortness of breath.  She has not taken any medication for her symptoms, it is worse with position change.  She otherwise has no complaints.       Past Medical History:   Diagnosis Date   ??? GERD (gastroesophageal reflux disease)    ??? Hypothyroidism    ??? Mixed hyperlipidemia    ??? Mixed stress and urge urinary incontinence    ??? Osteopenia    ??? Vertigo        Past Surgical History:   Procedure Laterality Date   ??? CYST REMOVAL     ??? HYSTERECTOMY      total   ??? TONSILLECTOMY     ??? TUBAL LIGATION         Patient  reports that she quit smoking about 28 years ago. She has never used smokeless tobacco. She reports current alcohol use. She reports that she does not use drugs.      Previous Medications    ATORVASTATIN (LIPITOR) 10 MG TABLET    Take 10 mg by mouth at bedtime.    FLUOXETINE (PROZAC) 40 MG CAPSULE    Take 1 capsule by mouth daily.    LEVOTHYROXINE (SYNTHROID, LEVOTHROID) 75 MCG TABLET    Take 75 mcg by mouth daily.    OMEPRAZOLE (PRILOSEC) 20 MG CAPSULE    Take 20 mg by mouth every morning before breakfast.       Allergies:   Allergies as of 11/29/2019   ??? (No Known Allergies)       Review of Systems     Review of Systems   Constitutional: Negative for chills and fever.   HENT: Negative for congestion, rhinorrhea and sore throat.    Respiratory: Negative for cough and shortness of breath.     Cardiovascular: Negative for chest pain and palpitations.   Gastrointestinal: Positive for nausea and vomiting. Negative for abdominal pain, constipation and diarrhea.   Genitourinary: Negative for decreased urine volume and difficulty urinating.   Musculoskeletal: Negative for arthralgias and myalgias.   Skin: Negative for color change and rash.   Neurological: Positive for dizziness. Negative for weakness, light-headedness, numbness and headaches.   All other systems reviewed and are negative.          Physical Exam     ED Triage Vitals [11/29/19 1902]   Vital Signs Group      Temp 97.2 ??F (36.2 ??C)      Temp Source Temporal      Heart Rate 72      Heart Rate Source Monitor      Resp 15      SpO2 99 %      BP 147/83      MAP (mmHg) 109      BP Location Left arm      BP Method Automatic      Patient  Position Sitting   SpO2 99 %   O2 Device None (Room air)       Physical Exam   Vitals reviewed.  Constitutional: She appears well-developed and well-nourished. No distress.   HENT:   Head: Normocephalic and atraumatic.   Mouth/Throat: Mucous membranes are dry.   Eyes: Pupils are equal, round, and reactive to light. Conjunctivae are normal. Right eye exhibits nystagmus. Left eye exhibits nystagmus.   Rightward horizontal nystagmus noted.   Neck: Normal range of motion and phonation normal. Neck supple.   Cardiovascular: Normal rate and regular rhythm. Exam reveals no gallop and no friction rub.   No murmur heard.  Pulmonary/Chest: Effort normal. No respiratory distress. She has no wheezes. She has no rhonchi. She has no rales.   Abdominal: Soft. She exhibits no distension. There is no abdominal tenderness.   Neurological: She is alert.  She is oriented to person, place and time.  She has normal strength. No cranial nerve deficit. Coordination normal.   Skin: Skin is warm, dry and intact. No petechiae and no rash noted.   Psychiatric: She has a normal mood and affect. Her behavior is normal.         Diagnostic Studies      EKG: Sinus rhythm at a rate of 67 with fusion complexes.  Normal axis.  PR interval 144 ms, QRS duration 90 ms, QT interval 46 ms, QTC 429 ms.  This is reviewed by Dr. Carey Bullocks.  There are no acute ischemic changes compared to her most recent EKG from February 2017.     Procedures    ED Course and MDM     Lyndsy Dispirito is a 74 y.o. female who presented to the emergency department with Dizziness and Nausea    The patient presents with vertiginous type symptoms for approximately 24 hours, similar to vertigo she had a few years ago.  She does have rightward horizontal nystagmus, otherwise no neurologic deficits.  IV access was established.  She was given IV fluids along with meclizine and Zofran after which she reported improvement.    Labs include an unremarkable CBC, renal panel and troponin.  Urinalysis results 30 protein, 20 ketones, small blood, it is otherwise unremarkable.    Chest x-ray shows hazy basilar airspace disease, atelectasis or pneumonia.  Head CT shows no acute intracranial abnormality.  CT of the neck show no evidence of dissection or pseudoaneurysm, no significant stenosis of the carotid or vertebral systems identified.  CT of the head shows mild scattered atherosclerosis without high-grade stenosis.    Given the patient's findings on her chest x-ray and low lymphocyte count, the patient was informed of the possibility of a Covid infection.  A swab was sent and she was given precautions.  The patient's symptoms are likely related to benign positional vertigo.  She is prescribed meclizine and Zofran.  She is referred to ENT should her symptoms persist.  She will return to the ED for any worsening symptoms.    The patient tolerated their visit well.  The patient and / or the family were informed of the results of any tests, a time was given to answer questions, a plan was proposed and they agreed with plan.     DISCHARGE DIAGNOSES:      ICD-9-CM ICD-10-CM    1. Vertigo  780.4 R42    2. Suspected  COVID-19 virus infection  V01.79 Z20.822        Discharge Medication List as of 11/29/2019 10:49 PM  START taking these medications    Details   meclizine (ANTIVERT) 25 mg tablet Take 1 tablet (25 mg total) by mouth 3 times a day as needed for Dizziness., Starting Sat 11/29/2019, Print, Disp-10 tablet, R-0      ondansetron (ZOFRAN ODT) 4 MG disintegrating tablet Take 1 tablet by mouth every 8 hours as needed for nausea., Print, Disp-10 tablet, R-0                    [place], [place]  11/30/19 [phone]

## 2019-11-29 NOTE — Unmapped (Signed)
dizzines and nausea since yesterday, hx of vertigo two years ago, feels same

## 2019-11-29 NOTE — ED Notes (Signed)
Covid swab obtained and carried to lab

## 2019-11-30 LAB — CBC
Hematocrit: 36.2 % (ref 35.0–45.0)
Hemoglobin: 12.1 g/dL (ref 11.7–15.5)
MCH: 27.8 pg (ref 27.0–33.0)
MCHC: 33.5 g/dL (ref 32.0–36.0)
MCV: 83 fL (ref 80.0–100.0)
MPV: 7.4 fL (ref 7.5–11.5)
Platelets: 285 10*3/uL (ref 140–400)
RBC: 4.36 10*6/uL (ref 3.80–5.10)
RDW: 15.3 % (ref 11.0–15.0)
WBC: 10.4 10*3/uL (ref 3.8–10.8)

## 2019-11-30 LAB — BASIC METABOLIC PANEL
Anion Gap: 8 mmol/L (ref 3–16)
BUN: 10 mg/dL (ref 7–25)
CO2: 28 mmol/L (ref 21–33)
Calcium: 9.5 mg/dL (ref 8.6–10.3)
Chloride: 101 mmol/L (ref 98–110)
Creatinine: 0.59 mg/dL (ref 0.60–1.30)
Glucose: 118 mg/dL (ref 70–100)
Osmolality, Calculated: 284 mOsm/kg (ref 278–305)
Potassium: 3.5 mmol/L (ref 3.5–5.3)
Sodium: 137 mmol/L (ref 133–146)
eGFR AA CKD-EPI: >90 See note.
eGFR NONAA CKD-EPI: >90 See note.

## 2019-11-30 LAB — HIGH SENSITIVITY TROPONIN: High Sensitivity Troponin: 3 ng/L (ref 0–14)

## 2019-11-30 LAB — 2019 NOVEL CORONAVIRUS (COVID-19), NAA-B: SARS-CoV-2: NOT DETECTED

## 2019-11-30 LAB — URINALYSIS W/RFL TO MICROSCOPIC
Bilirubin, UA: NEGATIVE
Glucose, UA: NEGATIVE mg/dL
Ketones, UA: 20 mg/dL
Leukocytes, UA: NEGATIVE
Nitrite, UA: NEGATIVE
Protein, UA: 30 mg/dL
RBC, UA: 6 /HPF (ref 0–3)
Specific Gravity, UA: 1.012 (ref 1.005–1.035)
Squam Epithel, UA: 1 /HPF (ref 0–5)
Urobilinogen, UA: <2 mg/dL (ref 0.2–1.9)
WBC, UA: 5 /HPF (ref 0–5)
pH, UA: 7 (ref 5.0–8.0)

## 2019-11-30 LAB — DIFFERENTIAL
Basophils Absolute: 62 /uL (ref 0–200)
Basophils Relative: 0.6 % (ref 0.0–1.0)
Eosinophils Absolute: 10 /uL (ref 15–500)
Eosinophils Relative: 0.1 % (ref 0.0–8.0)
Lymphocytes Absolute: 853 /uL (ref 850–3900)
Lymphocytes Relative: 8.2 % (ref 15.0–45.0)
Monocytes Absolute: 333 /uL (ref 200–950)
Monocytes Relative: 3.2 % (ref 0.0–12.0)
Neutrophils Absolute: 9142 /uL (ref 1500–7800)
Neutrophils Relative: 87.9 % (ref 40.0–80.0)
nRBC: 0 /100 WBC (ref 0–0)

## 2019-12-03 ENCOUNTER — Ambulatory Visit: Payer: Medicare Other

## 2019-12-30 DIAGNOSIS — Z0120 Encounter for dental examination and cleaning without abnormal findings: Secondary | ICD-10-CM | POA: Diagnosis not present

## 2020-01-06 ENCOUNTER — Inpatient Hospital Stay: Admit: 2020-01-06 | Discharge: 2020-01-10 | Payer: MEDICARE

## 2020-01-06 ENCOUNTER — Inpatient Hospital Stay: Admission: RE | Admit: 2020-01-06 | Payer: Medicare Other | Source: Ambulatory Visit

## 2020-01-06 DIAGNOSIS — R9389 Abnormal findings on diagnostic imaging of other specified body structures: Secondary | ICD-10-CM

## 2020-01-07 ENCOUNTER — Ambulatory Visit: Payer: Medicare Other

## 2020-03-10 ENCOUNTER — Ambulatory Visit
Admission: RE | Admit: 2020-03-10 | Discharge: 2020-03-10 | Disposition: A | Discharge status: Home / Self Care | Payer: Medicare Other | Source: Ambulatory Visit | Attending: Internal Medicine | Admitting: Internal Medicine

## 2020-03-10 ENCOUNTER — Other Ambulatory Visit: Payer: Self-pay

## 2020-03-10 DIAGNOSIS — Z1231 Encounter for screening mammogram for malignant neoplasm of breast: Secondary | ICD-10-CM | POA: Diagnosis not present

## 2020-05-05 DIAGNOSIS — Z0000 Encounter for general adult medical examination without abnormal findings: Secondary | ICD-10-CM | POA: Diagnosis not present

## 2020-05-05 DIAGNOSIS — E7800 Pure hypercholesterolemia, unspecified: Secondary | ICD-10-CM | POA: Diagnosis not present

## 2020-05-05 DIAGNOSIS — E038 Other specified hypothyroidism: Secondary | ICD-10-CM | POA: Diagnosis not present

## 2020-05-15 ENCOUNTER — Other Ambulatory Visit: Payer: Self-pay | Admitting: Cardiology

## 2020-05-18 DIAGNOSIS — Z0000 Encounter for general adult medical examination without abnormal findings: Secondary | ICD-10-CM | POA: Diagnosis not present

## 2020-05-18 DIAGNOSIS — E7800 Pure hypercholesterolemia, unspecified: Secondary | ICD-10-CM | POA: Diagnosis not present

## 2020-05-18 DIAGNOSIS — G9001 Carotid sinus syncope: Secondary | ICD-10-CM | POA: Diagnosis not present

## 2020-05-18 DIAGNOSIS — R82998 Other abnormal findings in urine: Secondary | ICD-10-CM | POA: Diagnosis not present

## 2020-05-18 DIAGNOSIS — N3281 Overactive bladder: Secondary | ICD-10-CM | POA: Diagnosis not present

## 2020-05-18 NOTE — Telephone Encounter (Signed)
Will Rx 90 days and have patient f/u with PCP for refills

## 2020-06-22 DIAGNOSIS — H40003 Preglaucoma, unspecified, bilateral: Secondary | ICD-10-CM | POA: Diagnosis not present

## 2020-06-22 DIAGNOSIS — H353132 Nonexudative age-related macular degeneration, bilateral, intermediate dry stage: Secondary | ICD-10-CM | POA: Diagnosis not present

## 2020-09-15 DIAGNOSIS — L820 Inflamed seborrheic keratosis: Secondary | ICD-10-CM | POA: Diagnosis not present

## 2020-09-15 DIAGNOSIS — L821 Other seborrheic keratosis: Secondary | ICD-10-CM | POA: Diagnosis not present

## 2020-09-15 DIAGNOSIS — L738 Other specified follicular disorders: Secondary | ICD-10-CM | POA: Diagnosis not present

## 2020-09-15 DIAGNOSIS — L812 Freckles: Secondary | ICD-10-CM | POA: Diagnosis not present

## 2020-10-19 DIAGNOSIS — Z23 Encounter for immunization: Secondary | ICD-10-CM | POA: Diagnosis not present

## 2020-10-24 ENCOUNTER — Emergency Department (HOSPITAL_COMMUNITY)
Admission: EM | Admit: 2020-10-24 | Discharge: 2020-10-24 | Disposition: A | Discharge status: Home / Self Care | Payer: Medicare Other | Attending: Emergency Medicine | Admitting: Emergency Medicine

## 2020-10-24 ENCOUNTER — Other Ambulatory Visit: Payer: Self-pay

## 2020-10-24 ENCOUNTER — Encounter (HOSPITAL_COMMUNITY): Payer: Self-pay | Admitting: Emergency Medicine

## 2020-10-24 ENCOUNTER — Emergency Department (HOSPITAL_COMMUNITY): Payer: Medicare Other

## 2020-10-24 DIAGNOSIS — E039 Hypothyroidism, unspecified: Secondary | ICD-10-CM | POA: Diagnosis not present

## 2020-10-24 DIAGNOSIS — J1089 Influenza due to other identified influenza virus with other manifestations: Secondary | ICD-10-CM | POA: Diagnosis not present

## 2020-10-24 DIAGNOSIS — Z20822 Contact with and (suspected) exposure to covid-19: Secondary | ICD-10-CM | POA: Diagnosis not present

## 2020-10-24 DIAGNOSIS — Z96642 Presence of left artificial hip joint: Secondary | ICD-10-CM | POA: Diagnosis not present

## 2020-10-24 DIAGNOSIS — Z79899 Other long term (current) drug therapy: Secondary | ICD-10-CM | POA: Diagnosis not present

## 2020-10-24 DIAGNOSIS — R519 Headache, unspecified: Secondary | ICD-10-CM | POA: Diagnosis present

## 2020-10-24 DIAGNOSIS — Z87891 Personal history of nicotine dependence: Secondary | ICD-10-CM | POA: Insufficient documentation

## 2020-10-24 DIAGNOSIS — J101 Influenza due to other identified influenza virus with other respiratory manifestations: Secondary | ICD-10-CM | POA: Diagnosis not present

## 2020-10-24 DIAGNOSIS — R531 Weakness: Secondary | ICD-10-CM | POA: Diagnosis not present

## 2020-10-24 LAB — BASIC METABOLIC PANEL
Anion gap: 12 (ref 5–15)
BUN: 11 mg/dL (ref 8–23)
CO2: 24 mmol/L (ref 22–32)
Calcium: 8.7 mg/dL — ABNORMAL LOW (ref 8.9–10.3)
Chloride: 100 mmol/L (ref 98–111)
Creatinine, Ser: 1.08 mg/dL — ABNORMAL HIGH (ref 0.44–1.00)
GFR, Estimated: 54 mL/min — ABNORMAL LOW (ref >60)
Glucose, Bld: 124 mg/dL — ABNORMAL HIGH (ref 70–99)
Potassium: 4 mmol/L (ref 3.5–5.1)
Sodium: 136 mmol/L (ref 135–145)

## 2020-10-24 LAB — CBC
HCT: 36.9 % (ref 36.0–46.0)
Hemoglobin: 11.9 g/dL — ABNORMAL LOW (ref 12.0–15.0)
MCH: 30.8 pg (ref 26.0–34.0)
MCHC: 32.2 g/dL (ref 30.0–36.0)
MCV: 95.6 fL (ref 80.0–100.0)
Platelets: 141 10*3/uL — ABNORMAL LOW (ref 150–400)
RBC: 3.86 MIL/uL — ABNORMAL LOW (ref 3.87–5.11)
RDW: 12.2 % (ref 11.5–15.5)
WBC: 5.8 10*3/uL (ref 4.0–10.5)
nRBC: 0 % (ref 0.0–0.2)

## 2020-10-24 LAB — URINALYSIS, ROUTINE W REFLEX MICROSCOPIC
Bilirubin Urine: NEGATIVE
Glucose, UA: NEGATIVE mg/dL
Hgb urine dipstick: NEGATIVE
Ketones, ur: NEGATIVE mg/dL
Leukocytes,Ua: NEGATIVE
Nitrite: NEGATIVE
Protein, ur: NEGATIVE mg/dL
Specific Gravity, Urine: 1.015 (ref 1.005–1.030)
pH: 6 (ref 5.0–8.0)

## 2020-10-24 LAB — RESP PANEL BY RT-PCR (FLU A&B, COVID) ARPGX2
Influenza A by PCR: POSITIVE — AB
Influenza B by PCR: NEGATIVE
SARS Coronavirus 2 by RT PCR: NEGATIVE

## 2020-10-24 MED ORDER — KETOROLAC TROMETHAMINE 30 MG/ML IJ SOLN
15.0000 mg | Freq: Once | INTRAMUSCULAR | Status: AC
  Administered 2020-10-24: 15 mg via INTRAVENOUS
  Filled 2020-10-24: qty 1

## 2020-10-24 MED ORDER — ACETAMINOPHEN 325 MG PO TABS
650.0000 mg | ORAL_TABLET | Freq: Once | ORAL | Status: AC | PRN
  Administered 2020-10-24: 650 mg via ORAL
  Filled 2020-10-24: qty 2

## 2020-10-24 MED ORDER — SODIUM CHLORIDE 0.9 % IV BOLUS
500.0000 mL | Freq: Once | INTRAVENOUS | Status: AC
  Administered 2020-10-24: 500 mL via INTRAVENOUS

## 2020-10-24 MED ORDER — PROCHLORPERAZINE EDISYLATE 10 MG/2ML IJ SOLN
5.0000 mg | Freq: Once | INTRAMUSCULAR | Status: AC
  Administered 2020-10-24: 5 mg via INTRAVENOUS
  Filled 2020-10-24: qty 2

## 2020-10-24 MED ORDER — DEXAMETHASONE SODIUM PHOSPHATE 4 MG/ML IJ SOLN
4.0000 mg | Freq: Once | INTRAMUSCULAR | Status: AC
  Administered 2020-10-24: 4 mg via INTRAVENOUS
  Filled 2020-10-24: qty 1

## 2020-10-24 MED ORDER — OSELTAMIVIR PHOSPHATE 75 MG PO CAPS: 75.0000 mg | ORAL_CAPSULE | Freq: Two times a day (BID) | ORAL | 0 refills | Status: DC

## 2020-10-24 NOTE — ED Notes (Signed)
Pt discharged ambulatory. All questions and concerns addressed. No complaints at this time.

## 2020-10-24 NOTE — ED Provider Notes (Signed)
[facility] EMERGENCY DEPARTMENT Provider Note   CSN: [id number] Arrival date & time: 10/24/20  0636     History Chief Complaint  Patient presents with  . Weakness  . Fever  . Generalized Body Aches    Kristen Figueroa is a 74 y.o. female who presents emergency department with influenza-like illness and headache.  She had her flu vaccine last Tuesday. Last Friday, the patient developed a throbbing headache, lightheadedness, nausea, sore throat, cough, facial and ear pressure and pain.  She has been running fevers at home, had decreased appetite and generalized myalgias.  This has been going on for 3 days.  She does not get headaches.  That is her primary complaint.  It does improve when she takes Motrin or Tylenol.  She denies neck stiffness, changes in vision, photophobia, phonophobia, unilateral weakness.  HPI     Past Medical History:  Diagnosis Date  . Allergy    RHINITIS  . Arthritis   . Bradycardia   . Dyslipidemia   . Headache   . Hypothyroidism   . Vision abnormalities     Patient Active Problem List   Diagnosis Date Noted  . Postural dizziness with presyncope 09/26/2018  . 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 Sick sinus syndrome due to SA node dysfunction (HCC) 09/25/2018  . Other headache syndrome 05/23/2018  . Abnormal brain MRI 05/23/2018  . Near syncope 05/23/2018    Past Surgical History:  Procedure Laterality Date  . HIP ARTHROPLASTY Left   . left hip replacement    . NECK LIFT    . TUBAL LIGATION    . TUBAL LIGATION    . UPPER GASTROINTESTINAL ENDOSCOPY  09/07/2010     OB History   No obstetric history on file.     Family History  Problem Relation Age of Onset  . Bone cancer Mother        unknown origin mets to bones  . Thyroid nodules Mother   . Thyroid disease Sister   . Diabetes Paternal Grandfather   . Heart disease Father   . Hypertension Father   . Thyroid disease Other        number of family  members  . Thyroid disease Niece   . Thyroid disease Nephew   . Glaucoma Maternal Grandfather     Social History   Tobacco Use  . Smoking status: Former Games developer  . Smokeless tobacco: Never Used  Substance Use Topics  . Alcohol use: Yes    Alcohol/week: 4.0 standard drinks    Types: 4 Glasses of wine per week  . Drug use: No    Home Medications Prior to Admission medications   Medication Sig Start Date End Date Taking? Authorizing Provider  acetaminophen (TYLENOL) 500 MG tablet Take 500 mg by mouth every 6 (six) hours as needed for mild pain.    [provider]  ALPRAZolam Prudy Feeler) 0.25 MG tablet TAKE AS DIRECTED Patient taking differently: Take 0.25 mg by mouth at bedtime.  04/25/13   Ronnald Nian, MD  amoxicillin (AMOXIL) 500 MG capsule Take 2,000 mg by mouth See admin instructions. Take 1 hour prior to dental appointments 08/28/18   [provider]  Cholecalciferol (VITAMIN D3) 125 MCG (5000 UT) CAPS Take 5,000 Units by mouth daily.    [provider]  escitalopram (LEXAPRO) 10 MG tablet TAKE 1 TABLET(10 MG) BY MOUTH DAILY 05/18/20   Yates Decamp, MD  levothyroxine (SYNTHROID, LEVOTHROID) 50 MCG tablet Take 50 mcg  by mouth daily.    [provider]  Magnesium 400 MG CAPS Take 400 mg by mouth at bedtime.    [provider]  omeprazole (PRILOSEC) 20 MG capsule Take 20 mg by mouth daily.  10/06/13   [provider]    Allergies    Codeine  Review of Systems   Review of Systems Ten systems reviewed and are negative for acute change, except as noted in the HPI.   Physical Exam Updated Vital Signs BP (!) 115/56 (BP Location: Left Arm)   Pulse (!) 59   Temp 98.9 F (37.2 C) (Oral)   Resp 14   SpO2 97%   Physical Exam Vitals and nursing note reviewed.  Constitutional:      General: She is not in acute distress.    Appearance: She is well-developed and well-nourished. She is ill-appearing. She is not toxic-appearing or  diaphoretic.  HENT:     Head: Normocephalic and atraumatic.     Right Ear: Tympanic membrane normal.     Left Ear: Tympanic membrane normal.     Mouth/Throat:     Mouth: Oropharynx is clear and moist.  Eyes:     General: No scleral icterus.    Extraocular Movements: EOM normal.     Conjunctiva/sclera: Conjunctivae normal.     Pupils: Pupils are equal, round, and reactive to light.     Comments: No horizontal, vertical or rotational nystagmus  Neck:     Comments: Full active and passive ROM without pain No midline or paraspinal tenderness No nuchal rigidity or meningeal signs Cardiovascular:     Rate and Rhythm: Normal rate and regular rhythm.     Pulses: Intact distal pulses.  Pulmonary:     Effort: Pulmonary effort is normal. No respiratory distress.     Breath sounds: Normal breath sounds. No wheezing or rales.  Abdominal:     General: Bowel sounds are normal.     Palpations: Abdomen is soft.     Tenderness: There is no abdominal tenderness. There is no guarding or rebound.  Musculoskeletal:        General: Normal range of motion.     Cervical back: Normal range of motion and neck supple.  Lymphadenopathy:     Cervical: No cervical adenopathy.  Skin:    General: Skin is warm and dry.     Findings: No rash.  Neurological:     Mental Status: She is alert and oriented to person, place, and time.     Cranial Nerves: No cranial nerve deficit.     Motor: No abnormal muscle tone.     Coordination: Coordination normal.     Comments: Mental Status:  Alert, oriented, thought content appropriate. Speech fluent without evidence of aphasia. Able to follow 2 step commands without difficulty.  Cranial Nerves:  II:  Peripheral visual fields grossly normal, pupils equal, round, reactive to light III,IV, VI: ptosis not present, extra-ocular motions intact bilaterally  V,VII: smile symmetric, facial light touch sensation equal VIII: hearing grossly normal bilaterally  IX,X: midline  uvula rise  XI: bilateral shoulder shrug equal and strong XII: midline tongue extension  Motor:  5/5 in upper and lower extremities bilaterally including strong and equal grip strength and dorsiflexion/plantar flexion Sensory: Pinprick and light touch normal in all extremities.  Cerebellar: normal finger-to-nose with bilateral upper extremities Gait: normal gait and balance CV: distal pulses palpable throughout   Psychiatric:        Mood and Affect: Mood and affect  normal.        Behavior: Behavior normal.        Thought Content: Thought content normal.        Judgment: Judgment normal.     ED Results / Procedures / Treatments   Labs (all labs ordered are listed, but only abnormal results are displayed) Labs Reviewed  RESP PANEL BY RT-PCR (FLU A&B, COVID) ARPGX2 - Abnormal; Notable for the following components:      Result Value   Influenza A by PCR POSITIVE (*)    All other components within normal limits  BASIC METABOLIC PANEL - Abnormal; Notable for the following components:   Glucose, Bld 124 (*)    Creatinine, Ser 1.08 (*)    Calcium 8.7 (*)    GFR, Estimated 54 (*)    All other components within normal limits  CBC - Abnormal; Notable for the following components:   RBC 3.86 (*)    Hemoglobin 11.9 (*)    Platelets 141 (*)    All other components within normal limits  URINALYSIS, ROUTINE W REFLEX MICROSCOPIC  CBG MONITORING, ED    EKG EKG Interpretation  Date/Time:  Sunday October 24 2020 08:05:30 EST Ventricular Rate:  69 PR Interval:  140 QRS Duration: 78 QT Interval:  370 QTC Calculation: 396 R Axis:   53 Text Interpretation: Normal sinus rhythm Nonspecific ST abnormality Abnormal ECG No significant change since last tracing Confirmed by Linwood Dibbles [phone]) on 10/24/2020 11:52:13 AM   Radiology DG Chest 1 View  Result Date: 10/24/2020 CLINICAL DATA:  fever EXAM: CHEST  1 VIEW COMPARISON:  01/14/2011. FINDINGS: Hazy right basilar opacities. No  pneumothorax or pleural effusion. Cardiomediastinal silhouette is within normal limits. No acute osseous abnormality. IMPRESSION: Hazy right basilar opacities. Differential includes atelectasis, sequela of aspiration or infection. Electronically Signed   By: Stana Bunting M.D.   On: 10/24/2020 13:12    Procedures Procedures (including critical care time)  Medications Ordered in ED Medications  acetaminophen (TYLENOL) tablet 650 mg (650 mg Oral Given 10/24/20 0822)  sodium chloride 0.9 % bolus 500 mL (500 mLs Intravenous New Bag/Given 10/24/20 1312)  dexamethasone (DECADRON) injection 4 mg (4 mg Intravenous Given 10/24/20 1313)  ketorolac (TORADOL) 30 MG/ML injection 15 mg (15 mg Intravenous Given 10/24/20 1313)  prochlorperazine (COMPAZINE) injection 5 mg (5 mg Intravenous Given 10/24/20 1313)    ED Course  I have reviewed the triage vital signs and the nursing notes.  Pertinent labs & imaging results that were available during my care of the patient were reviewed by me and considered in my medical decision making (see chart for details).    MDM Rules/Calculators/A&P                         [id number] VS: BP (!) 115/56 (BP Location: Left Arm)   Pulse (!) 59   Temp 98.9 F (37.2 C) (Oral)   Resp 14   SpO2 97%  KC:LEXNTZG is gathered by patient and emr. Previous records obtained and reviewed. DDX:The patient's complaint of headache involves an extensive number of diagnostic and treatment options, and is a complaint that carries with it a high risk of complications, morbidity, and potential mortality. Given the large differential diagnosis, medical decision making is of high complexity. Emergent considerations for headache include subarachnoid hemorrhage, meningitis, temporal arteritis, glaucoma, cerebral ischemia, carotid/vertebral dissection, intracranial tumor, Venous sinus thrombosis, carbon monoxide poisoning, acute or chronic subdural hemorrhage.  Other considerations  include: Migraine,  Cluster headache, Hypertension, Caffeine, alcohol, or drug withdrawal, Pseudotumor cerebri, Arteriovenous malformation, Head injury, Neurocysticercosis, Post-lumbar puncture, Preeclampsia, Tension headache, Sinusitis, Cervical arthritis, Refractive error causing strain, Dental abscess, Otitis media, Temporomandibular joint syndrome, Depression, Somatoform disorder (eg, somatization) Trigeminal neuralgia, Glossopharyngeal neuralgia. Labs: I ordered reviewed and interpreted labs which include CBC with mild anemia of insignificant value, mild thrombocytopenia also insignificant.  BMP with mildly elevated blood glucose, mild dehydration, creatinine at baseline, respiratory panel positive for influenza a. Imaging: I ordered and reviewed images which included 1 view chest x-ray. I independently visualized and interpreted all imaging. Significant findings include hazy bilateral opacities which may represent atelectasis or potentially early viral pneumonia.  EKG: Normal sinus rhythm at a rate of 69 Consults: MDM: Patient here with flulike symptoms and headache.  Headache seems to be secondary to her viral process.  She does not have any evidence of meningitis, encephalitis, normal neurologic exam.  I discussed the case with Dr. Lynelle Doctor who agrees with work-up and plan for discharge at this time.  Headache improved with treatment.  Discharged with Tamiflu and close outpatient follow-up.  Discussed return precautions. Patient disposition:The patient appears reasonably screened and/or stabilized for discharge and I doubt any other medical condition or other [facility] requiring further screening, evaluation, or treatment in the ED at this time prior to discharge. I have discussed lab and/or imaging findings with the patient and answered all questions/concerns to the best of my ability.I have discussed return precautions and OP follow up.     Final Clinical Impression(s) / ED Diagnoses Final diagnoses:   None    Rx / DC Orders ED Discharge Orders    None       Arthor Captain, PA-C 10/24/20 1637    Linwood Dibbles, MD 10/25/20 1454

## 2020-10-24 NOTE — Discharge Instructions (Addendum)

## 2020-10-24 NOTE — ED Triage Notes (Signed)
Pt received flu shot on Tuesday.  Reports generalized body aches, sore throat, headache, weakness, and dizziness since Friday.  Did negative home COVID test on Friday.  Last Tylenol 3-4am.

## 2020-11-12 ENCOUNTER — Other Ambulatory Visit: Payer: Self-pay | Admitting: Cardiology

## 2020-12-30 DIAGNOSIS — R1030 Lower abdominal pain, unspecified: Secondary | ICD-10-CM | POA: Diagnosis not present

## 2020-12-30 DIAGNOSIS — E039 Hypothyroidism, unspecified: Secondary | ICD-10-CM | POA: Diagnosis not present

## 2020-12-30 DIAGNOSIS — K529 Noninfective gastroenteritis and colitis, unspecified: Secondary | ICD-10-CM | POA: Diagnosis not present

## 2021-01-06 DIAGNOSIS — R1011 Right upper quadrant pain: Secondary | ICD-10-CM | POA: Diagnosis not present

## 2021-01-06 DIAGNOSIS — K219 Gastro-esophageal reflux disease without esophagitis: Secondary | ICD-10-CM | POA: Diagnosis not present

## 2021-01-06 DIAGNOSIS — K7689 Other specified diseases of liver: Secondary | ICD-10-CM | POA: Diagnosis not present

## 2021-05-26 DIAGNOSIS — R82998 Other abnormal findings in urine: Secondary | ICD-10-CM | POA: Diagnosis not present

## 2021-05-26 DIAGNOSIS — Z1212 Encounter for screening for malignant neoplasm of rectum: Secondary | ICD-10-CM | POA: Diagnosis not present

## 2021-05-30 ENCOUNTER — Other Ambulatory Visit (HOSPITAL_COMMUNITY): Payer: Self-pay | Admitting: Internal Medicine

## 2021-06-22 ENCOUNTER — Other Ambulatory Visit: Payer: Self-pay

## 2021-06-22 ENCOUNTER — Ambulatory Visit (HOSPITAL_BASED_OUTPATIENT_CLINIC_OR_DEPARTMENT_OTHER)
Admission: RE | Admit: 2021-06-22 | Discharge: 2021-06-22 | Disposition: A | Discharge status: Home / Self Care | Payer: Medicare Other | Source: Ambulatory Visit | Attending: Internal Medicine | Admitting: Internal Medicine

## 2021-06-22 DIAGNOSIS — E7800 Pure hypercholesterolemia, unspecified: Secondary | ICD-10-CM | POA: Insufficient documentation

## 2021-06-22 DIAGNOSIS — I700 Atherosclerosis of aorta: Secondary | ICD-10-CM | POA: Insufficient documentation

## 2021-08-25 DIAGNOSIS — Z961 Presence of intraocular lens: Secondary | ICD-10-CM | POA: Diagnosis not present

## 2021-08-25 DIAGNOSIS — H353132 Nonexudative age-related macular degeneration, bilateral, intermediate dry stage: Secondary | ICD-10-CM | POA: Diagnosis not present

## 2021-09-15 DIAGNOSIS — L821 Other seborrheic keratosis: Secondary | ICD-10-CM | POA: Diagnosis not present

## 2021-09-15 DIAGNOSIS — L570 Actinic keratosis: Secondary | ICD-10-CM | POA: Diagnosis not present

## 2021-09-15 DIAGNOSIS — L812 Freckles: Secondary | ICD-10-CM | POA: Diagnosis not present

## 2021-09-19 DIAGNOSIS — M9903 Segmental and somatic dysfunction of lumbar region: Secondary | ICD-10-CM | POA: Diagnosis not present

## 2021-09-19 DIAGNOSIS — M5441 Lumbago with sciatica, right side: Secondary | ICD-10-CM | POA: Diagnosis not present

## 2021-09-20 DIAGNOSIS — M9903 Segmental and somatic dysfunction of lumbar region: Secondary | ICD-10-CM | POA: Diagnosis not present

## 2021-09-20 DIAGNOSIS — M5441 Lumbago with sciatica, right side: Secondary | ICD-10-CM | POA: Diagnosis not present

## 2021-09-21 DIAGNOSIS — M9903 Segmental and somatic dysfunction of lumbar region: Secondary | ICD-10-CM | POA: Diagnosis not present

## 2021-09-21 DIAGNOSIS — M5441 Lumbago with sciatica, right side: Secondary | ICD-10-CM | POA: Diagnosis not present

## 2021-09-22 DIAGNOSIS — M5441 Lumbago with sciatica, right side: Secondary | ICD-10-CM | POA: Diagnosis not present

## 2021-09-22 DIAGNOSIS — M9903 Segmental and somatic dysfunction of lumbar region: Secondary | ICD-10-CM | POA: Diagnosis not present

## 2021-09-27 DIAGNOSIS — M9903 Segmental and somatic dysfunction of lumbar region: Secondary | ICD-10-CM | POA: Diagnosis not present

## 2021-09-27 DIAGNOSIS — M5441 Lumbago with sciatica, right side: Secondary | ICD-10-CM | POA: Diagnosis not present

## 2021-09-28 DIAGNOSIS — M5441 Lumbago with sciatica, right side: Secondary | ICD-10-CM | POA: Diagnosis not present

## 2021-09-28 DIAGNOSIS — M9903 Segmental and somatic dysfunction of lumbar region: Secondary | ICD-10-CM | POA: Diagnosis not present

## 2021-10-11 DIAGNOSIS — M9903 Segmental and somatic dysfunction of lumbar region: Secondary | ICD-10-CM | POA: Diagnosis not present

## 2021-10-11 DIAGNOSIS — M5441 Lumbago with sciatica, right side: Secondary | ICD-10-CM | POA: Diagnosis not present

## 2021-10-12 DIAGNOSIS — M5441 Lumbago with sciatica, right side: Secondary | ICD-10-CM | POA: Diagnosis not present

## 2021-10-12 DIAGNOSIS — M9903 Segmental and somatic dysfunction of lumbar region: Secondary | ICD-10-CM | POA: Diagnosis not present

## 2021-10-19 DIAGNOSIS — M9903 Segmental and somatic dysfunction of lumbar region: Secondary | ICD-10-CM | POA: Diagnosis not present

## 2021-10-19 DIAGNOSIS — M5441 Lumbago with sciatica, right side: Secondary | ICD-10-CM | POA: Diagnosis not present

## 2021-10-24 DIAGNOSIS — M5441 Lumbago with sciatica, right side: Secondary | ICD-10-CM | POA: Diagnosis not present

## 2021-10-24 DIAGNOSIS — M9903 Segmental and somatic dysfunction of lumbar region: Secondary | ICD-10-CM | POA: Diagnosis not present

## 2021-10-26 DIAGNOSIS — M9903 Segmental and somatic dysfunction of lumbar region: Secondary | ICD-10-CM | POA: Diagnosis not present

## 2021-10-26 DIAGNOSIS — M5441 Lumbago with sciatica, right side: Secondary | ICD-10-CM | POA: Diagnosis not present

## 2022-06-09 DIAGNOSIS — K219 Gastro-esophageal reflux disease without esophagitis: Secondary | ICD-10-CM | POA: Diagnosis not present

## 2022-06-09 DIAGNOSIS — E039 Hypothyroidism, unspecified: Secondary | ICD-10-CM | POA: Diagnosis not present

## 2022-06-09 DIAGNOSIS — E049 Nontoxic goiter, unspecified: Secondary | ICD-10-CM | POA: Diagnosis not present

## 2022-08-01 ENCOUNTER — Other Ambulatory Visit: Payer: Self-pay | Admitting: Internal Medicine

## 2022-08-01 DIAGNOSIS — M542 Cervicalgia: Secondary | ICD-10-CM | POA: Diagnosis not present

## 2022-08-01 DIAGNOSIS — Z0000 Encounter for general adult medical examination without abnormal findings: Secondary | ICD-10-CM | POA: Diagnosis not present

## 2022-08-01 DIAGNOSIS — E049 Nontoxic goiter, unspecified: Secondary | ICD-10-CM

## 2022-08-01 DIAGNOSIS — Z136 Encounter for screening for cardiovascular disorders: Secondary | ICD-10-CM | POA: Diagnosis not present

## 2022-08-01 DIAGNOSIS — K219 Gastro-esophageal reflux disease without esophagitis: Secondary | ICD-10-CM | POA: Diagnosis not present

## 2022-08-01 DIAGNOSIS — Z1331 Encounter for screening for depression: Secondary | ICD-10-CM | POA: Diagnosis not present

## 2022-08-01 DIAGNOSIS — E039 Hypothyroidism, unspecified: Secondary | ICD-10-CM | POA: Diagnosis not present

## 2022-08-07 ENCOUNTER — Ambulatory Visit
Admission: RE | Admit: 2022-08-07 | Discharge: 2022-08-07 | Disposition: A | Discharge status: Home / Self Care | Payer: Medicare Other | Source: Ambulatory Visit | Attending: Internal Medicine | Admitting: Internal Medicine

## 2022-08-07 ENCOUNTER — Encounter: Payer: Self-pay | Admitting: Internal Medicine

## 2022-08-07 DIAGNOSIS — E041 Nontoxic single thyroid nodule: Secondary | ICD-10-CM | POA: Diagnosis not present

## 2022-08-07 DIAGNOSIS — E049 Nontoxic goiter, unspecified: Secondary | ICD-10-CM

## 2022-08-14 DIAGNOSIS — I10 Essential (primary) hypertension: Secondary | ICD-10-CM | POA: Diagnosis not present

## 2022-08-14 DIAGNOSIS — E7800 Pure hypercholesterolemia, unspecified: Secondary | ICD-10-CM | POA: Diagnosis not present

## 2022-08-14 DIAGNOSIS — N1831 Chronic kidney disease, stage 3a: Secondary | ICD-10-CM | POA: Diagnosis not present

## 2022-08-16 DIAGNOSIS — E042 Nontoxic multinodular goiter: Secondary | ICD-10-CM | POA: Diagnosis not present

## 2022-08-16 DIAGNOSIS — R490 Dysphonia: Secondary | ICD-10-CM | POA: Diagnosis not present

## 2022-08-16 DIAGNOSIS — E039 Hypothyroidism, unspecified: Secondary | ICD-10-CM | POA: Diagnosis not present

## 2022-08-16 DIAGNOSIS — R1310 Dysphagia, unspecified: Secondary | ICD-10-CM | POA: Diagnosis not present

## 2022-08-23 ENCOUNTER — Other Ambulatory Visit: Payer: Self-pay | Admitting: Internal Medicine

## 2022-08-23 DIAGNOSIS — E042 Nontoxic multinodular goiter: Secondary | ICD-10-CM

## 2022-08-29 DIAGNOSIS — E7800 Pure hypercholesterolemia, unspecified: Secondary | ICD-10-CM | POA: Diagnosis not present

## 2022-08-29 DIAGNOSIS — I10 Essential (primary) hypertension: Secondary | ICD-10-CM | POA: Diagnosis not present

## 2022-09-12 ENCOUNTER — Encounter: Payer: Self-pay | Admitting: Internal Medicine

## 2022-09-12 ENCOUNTER — Ambulatory Visit: Payer: Medicare Other | Admitting: Internal Medicine

## 2022-09-12 VITALS — BP 116/60 | HR 66 | Resp 16 | Ht 67.0 in | Wt 150.0 lb

## 2022-09-12 DIAGNOSIS — E039 Hypothyroidism, unspecified: Secondary | ICD-10-CM

## 2022-09-12 DIAGNOSIS — I10 Essential (primary) hypertension: Secondary | ICD-10-CM | POA: Diagnosis not present

## 2022-09-12 DIAGNOSIS — E782 Mixed hyperlipidemia: Secondary | ICD-10-CM

## 2022-09-12 NOTE — Progress Notes (Signed)
Primary Physician/Referring:  Emilio Aspen, MD  Patient ID: Kristen Figueroa, female    DOB: [birth year], 76 y.o.   MRN: [id number]  Chief Complaint  Patient presents with   New Patient (Initial Visit)   Hypertension   HPI:    Kristen Figueroa  is a 76 y.o. female with past medical history significant for hypertension, hyperlipidemia, and hypothyroidism who is here to establish care with cardiology. Her blood pressure is well controlled on her current regiment and she tolerates her medications without issue. She was prescribed a statin for her high cholesterol but she has not started taking it yet. Discussed with patient that it would be beneficial for her to start the statin. Otherwise, she does have a cracking neck pain on her right side that is constant and recently been worse. She has not seen an orthopedic doctor in a while and she will go back to them and get her neck checked out. Denies chest pain, shortness of breath, palpitations, diaphoresis, syncope, edema, PND, orthopnea.   Past Medical History:  Diagnosis Date   Allergy    RHINITIS   Arthritis    Bradycardia    Dyslipidemia    Headache    Hypothyroidism    Vision abnormalities    Past Surgical History:  Procedure Laterality Date   DENTAL SURGERY     HIP ARTHROPLASTY Left    left hip replacement     NECK LIFT     TUBAL LIGATION     TUBAL LIGATION     UPPER GASTROINTESTINAL ENDOSCOPY  09/07/2010   Family History  Problem Relation Age of Onset   Bone cancer Mother        unknown origin mets to bones   Thyroid nodules Mother    Heart disease Father    Hypertension Father    Thyroid disease Sister    Heart disease Brother    Glaucoma Maternal Grandfather    Diabetes Paternal Grandfather    Thyroid disease Nephew    Thyroid disease Niece    Thyroid disease Other        number of family members    Social History   Tobacco Use   Smoking status: Former    Packs/day: 0.50    Years: 40.00    Total pack  years: 20.00    Types: Cigarettes    Quit date: 2000    Years since quitting: 23.8   Smokeless tobacco: Never  Substance Use Topics   Alcohol use: Yes    Alcohol/week: 4.0 standard drinks of alcohol    Types: 4 Glasses of wine per week    Comment: occ   Marital Status: Married  ROS  Review of Systems  Cardiovascular:  Negative for chest pain, claudication, irregular heartbeat, leg swelling, palpitations and paroxysmal nocturnal dyspnea.  Musculoskeletal:  Positive for neck pain.   Objective  Blood pressure 116/60, pulse 66, resp. rate 16, height 5\' 7"  (1.702 m), weight 150 lb (68 kg), SpO2 98 %. Body mass index is 23.49 kg/m.     09/12/2022    8:35 AM 10/24/2020    2:00 PM 10/24/2020    1:30 PM  Vitals with BMI  Height 5\' 7"     Weight 150 lbs    BMI 23.49    Systolic 116 117 14/10/2020  Diastolic 60 60 57  Pulse 66 59 56     Physical Exam Vitals reviewed.  HENT:     Head: Normocephalic and atraumatic.  Neck:  Vascular: No carotid bruit.  Cardiovascular:     Rate and Rhythm: Normal rate and regular rhythm.     Pulses: Normal pulses.     Heart sounds: Normal heart sounds. No murmur heard. Pulmonary:     Effort: Pulmonary effort is normal.     Breath sounds: Normal breath sounds.  Musculoskeletal:     Right lower leg: No edema.     Left lower leg: No edema.  Skin:    General: Skin is warm and dry.  Neurological:     Mental Status: She is alert.     Medications and allergies   Allergies  Allergen Reactions   Codeine Other (See Comments)    REACTION: stomach cramps, swelling   Levofloxacin Nausea Only   Tetanus Antitoxin     Other reaction(s): Unknown     Medication list after today's encounter   Current Outpatient Medications:    acetaminophen (TYLENOL) 500 MG tablet, Take 500 mg by mouth every 6 (six) hours as needed for mild pain., Disp: , Rfl:    Cholecalciferol (VITAMIN D3) 125 MCG (5000 UT) CAPS, Take 5,000 Units by mouth daily., Disp: , Rfl:     escitalopram (LEXAPRO) 10 MG tablet, TAKE 1 TABLET(10 MG) BY MOUTH DAILY, Disp: 90 tablet, Rfl: 0   fluticasone (FLONASE ALLERGY RELIEF) 50 MCG/ACT nasal spray, 1 spray in each nostril Nasally Once a day for 30 day(s), Disp: , Rfl:    levothyroxine (SYNTHROID, LEVOTHROID) 50 MCG tablet, Take 50 mcg by mouth daily., Disp: , Rfl:    Magnesium 400 MG CAPS, Take 400 mg by mouth at bedtime., Disp: , Rfl:    omeprazole (PRILOSEC) 20 MG capsule, Take 20 mg by mouth daily. , Disp: , Rfl:    telmisartan (MICARDIS) 40 MG tablet, Take 40 mg by mouth daily., Disp: , Rfl:    amoxicillin (AMOXIL) 500 MG capsule, Take 2,000 mg by mouth See admin instructions. Take 1 hour prior to dental appointments (Patient not taking: Reported on 09/12/2022), Disp: , Rfl: 2  Laboratory examination:   Lab Results  Component Value Date   NA 136 10/24/2020   K 4.0 10/24/2020   CO2 24 10/24/2020   GLUCOSE 124 (H) 10/24/2020   BUN 11 10/24/2020   CREATININE 1.08 (H) 10/24/2020   CALCIUM 8.7 (L) 10/24/2020   GFRNONAA 54 (L) 10/24/2020       Latest Ref Rng & Units 10/24/2020    8:10 AM 09/25/2018    5:32 PM 01/21/2011    5:40 AM  CMP  Glucose 70 - 99 mg/dL 858  89  850   BUN 8 - 23 mg/dL 11  18  6    Creatinine 0.44 - 1.00 mg/dL  2.77  4.12   Sodium 135 - 145 mmol/L 136  142  136   Potassium 3.5 - 5.1 mmol/L 4.0  4.1  3.5   Chloride 98 - 111 mmol/L 100  107  102   CO2 22 - 32 mmol/L 24  30  29    Calcium 8.9 - 10.3 mg/dL 8.7  9.3  8.2       Latest Ref Rng & Units 10/24/2020    8:10 AM 09/25/2018    5:32 PM 01/21/2011    5:40 AM  CBC  WBC 4.0 - 10.5 K/uL 5.8  5.8  6.4   Hemoglobin 12.0 - 15.0 g/dL 09/27/2018  03/23/2011  8.2   Hematocrit 36.0 - 46.0 % 36.9  40.2  24.8   Platelets 150 -  400 K/uL 141  199  174     Lipid Panel No results for input(s): "CHOL", "TRIG", "LDLCALC", "VLDL", "HDL", "CHOLHDL", "LDLDIRECT" in the last 8760 hours.  HEMOGLOBIN A1C No results found for: "HGBA1C", "MPG" TSH No results for  input(s): "TSH" in the last 8760 hours.  External labs:     Radiology:    Cardiac Studies:   No results found for this or any previous visit from the past 1095 days.     No results found for this or any previous visit from the past 1095 days.     EKG:   09/12/2022: NSR, normal axis, normal R wave progression, no evidence of ischemia  Assessment     ICD-10-CM   1. Primary hypertension  I10 EKG 12-Lead    PCV ECHOCARDIOGRAM COMPLETE    2. Mixed hyperlipidemia  E78.2     3. Hypothyroidism, unspecified type  E03.9        Orders Placed This Encounter  Procedures   EKG 12-Lead   PCV ECHOCARDIOGRAM COMPLETE    Standing Status:   Future    Standing Expiration Date:   09/13/2023    No orders of the defined types were placed in this encounter.   Medications Discontinued During This Encounter  Medication Reason   ALPRAZolam (XANAX) 0.25 MG tablet    oseltamivir (TAMIFLU) 75 MG capsule      Recommendations:   Kristen Figueroa is a 76 y.o.  F with HTN and HLD  Primary hypertension Continue current cardiac medications. Encourage low-sodium diet, less than 2000 mg daily. Echocardiogram ordered Follow-up in 2-3 months or sooner if needed   Mixed hyperlipidemia Encourage patient to start statin Lipids followed by PCP   Hypothyroidism, unspecified type Managed by primary  Total time spent with patient was 45 minutes and greater than 50% of that time was spent in counseling and coordination care with the patient regarding complex decision making and discussion as state above.     Floydene Flock, DO, [facility]  09/12/2022, 10:52 AM Office: [phone] Pager: [phone]

## 2022-09-19 ENCOUNTER — Ambulatory Visit: Payer: Medicare Other

## 2022-09-19 DIAGNOSIS — I10 Essential (primary) hypertension: Secondary | ICD-10-CM

## 2022-09-27 DIAGNOSIS — M9901 Segmental and somatic dysfunction of cervical region: Secondary | ICD-10-CM | POA: Diagnosis not present

## 2022-09-27 DIAGNOSIS — M5413 Radiculopathy, cervicothoracic region: Secondary | ICD-10-CM | POA: Diagnosis not present

## 2022-09-27 DIAGNOSIS — M5442 Lumbago with sciatica, left side: Secondary | ICD-10-CM | POA: Diagnosis not present

## 2022-09-27 DIAGNOSIS — M9903 Segmental and somatic dysfunction of lumbar region: Secondary | ICD-10-CM | POA: Diagnosis not present

## 2022-10-03 DIAGNOSIS — L821 Other seborrheic keratosis: Secondary | ICD-10-CM | POA: Diagnosis not present

## 2022-10-03 DIAGNOSIS — D224 Melanocytic nevi of scalp and neck: Secondary | ICD-10-CM | POA: Diagnosis not present

## 2022-10-03 DIAGNOSIS — M5442 Lumbago with sciatica, left side: Secondary | ICD-10-CM | POA: Diagnosis not present

## 2022-10-03 DIAGNOSIS — M5413 Radiculopathy, cervicothoracic region: Secondary | ICD-10-CM | POA: Diagnosis not present

## 2022-10-03 DIAGNOSIS — M9901 Segmental and somatic dysfunction of cervical region: Secondary | ICD-10-CM | POA: Diagnosis not present

## 2022-10-03 DIAGNOSIS — L812 Freckles: Secondary | ICD-10-CM | POA: Diagnosis not present

## 2022-10-03 DIAGNOSIS — M9903 Segmental and somatic dysfunction of lumbar region: Secondary | ICD-10-CM | POA: Diagnosis not present

## 2022-10-03 DIAGNOSIS — L820 Inflamed seborrheic keratosis: Secondary | ICD-10-CM | POA: Diagnosis not present

## 2022-10-10 DIAGNOSIS — M9901 Segmental and somatic dysfunction of cervical region: Secondary | ICD-10-CM | POA: Diagnosis not present

## 2022-10-10 DIAGNOSIS — M5413 Radiculopathy, cervicothoracic region: Secondary | ICD-10-CM | POA: Diagnosis not present

## 2022-10-10 DIAGNOSIS — M9903 Segmental and somatic dysfunction of lumbar region: Secondary | ICD-10-CM | POA: Diagnosis not present

## 2022-10-10 DIAGNOSIS — M5442 Lumbago with sciatica, left side: Secondary | ICD-10-CM | POA: Diagnosis not present

## 2022-10-12 ENCOUNTER — Ambulatory Visit
Admission: RE | Admit: 2022-10-12 | Discharge: 2022-10-12 | Disposition: A | Discharge status: Home / Self Care | Payer: Medicare Other | Source: Ambulatory Visit | Attending: Internal Medicine | Admitting: Internal Medicine

## 2022-10-12 ENCOUNTER — Other Ambulatory Visit (HOSPITAL_COMMUNITY)
Admission: RE | Admit: 2022-10-12 | Discharge: 2022-10-12 | Disposition: A | Discharge status: Home / Self Care | Payer: Medicare Other | Source: Ambulatory Visit | Attending: Radiology | Admitting: Radiology

## 2022-10-12 DIAGNOSIS — M9903 Segmental and somatic dysfunction of lumbar region: Secondary | ICD-10-CM | POA: Diagnosis not present

## 2022-10-12 DIAGNOSIS — E041 Nontoxic single thyroid nodule: Secondary | ICD-10-CM | POA: Insufficient documentation

## 2022-10-12 DIAGNOSIS — M9901 Segmental and somatic dysfunction of cervical region: Secondary | ICD-10-CM | POA: Diagnosis not present

## 2022-10-12 DIAGNOSIS — E042 Nontoxic multinodular goiter: Secondary | ICD-10-CM

## 2022-10-12 DIAGNOSIS — M5413 Radiculopathy, cervicothoracic region: Secondary | ICD-10-CM | POA: Diagnosis not present

## 2022-10-12 DIAGNOSIS — M5442 Lumbago with sciatica, left side: Secondary | ICD-10-CM | POA: Diagnosis not present

## 2022-10-16 LAB — CYTOLOGY - NON PAP

## 2022-10-17 DIAGNOSIS — M9903 Segmental and somatic dysfunction of lumbar region: Secondary | ICD-10-CM | POA: Diagnosis not present

## 2022-10-17 DIAGNOSIS — M5442 Lumbago with sciatica, left side: Secondary | ICD-10-CM | POA: Diagnosis not present

## 2022-10-17 DIAGNOSIS — M5413 Radiculopathy, cervicothoracic region: Secondary | ICD-10-CM | POA: Diagnosis not present

## 2022-10-17 DIAGNOSIS — M9901 Segmental and somatic dysfunction of cervical region: Secondary | ICD-10-CM | POA: Diagnosis not present

## 2022-10-31 DIAGNOSIS — E7800 Pure hypercholesterolemia, unspecified: Secondary | ICD-10-CM | POA: Diagnosis not present

## 2022-11-24 DIAGNOSIS — E7800 Pure hypercholesterolemia, unspecified: Secondary | ICD-10-CM | POA: Diagnosis not present

## 2022-11-24 DIAGNOSIS — R490 Dysphonia: Secondary | ICD-10-CM | POA: Diagnosis not present

## 2022-11-24 DIAGNOSIS — E039 Hypothyroidism, unspecified: Secondary | ICD-10-CM | POA: Diagnosis not present

## 2022-11-24 DIAGNOSIS — I10 Essential (primary) hypertension: Secondary | ICD-10-CM | POA: Diagnosis not present

## 2022-11-24 DIAGNOSIS — Z1239 Encounter for other screening for malignant neoplasm of breast: Secondary | ICD-10-CM | POA: Diagnosis not present

## 2022-11-24 DIAGNOSIS — E042 Nontoxic multinodular goiter: Secondary | ICD-10-CM | POA: Diagnosis not present

## 2022-11-24 DIAGNOSIS — E049 Nontoxic goiter, unspecified: Secondary | ICD-10-CM | POA: Diagnosis not present

## 2022-11-28 DIAGNOSIS — K080 Exfoliation of teeth due to systemic causes: Secondary | ICD-10-CM | POA: Diagnosis not present

## 2022-12-13 DIAGNOSIS — K219 Gastro-esophageal reflux disease without esophagitis: Secondary | ICD-10-CM | POA: Diagnosis not present

## 2022-12-14 ENCOUNTER — Ambulatory Visit: Payer: Medicare Other | Admitting: Internal Medicine

## 2022-12-14 ENCOUNTER — Encounter: Payer: Self-pay | Admitting: Internal Medicine

## 2022-12-14 VITALS — BP 114/56 | HR 58 | Ht 67.0 in | Wt 154.0 lb

## 2022-12-14 DIAGNOSIS — I10 Essential (primary) hypertension: Secondary | ICD-10-CM | POA: Diagnosis not present

## 2022-12-14 DIAGNOSIS — E782 Mixed hyperlipidemia: Secondary | ICD-10-CM

## 2022-12-14 NOTE — Progress Notes (Signed)
Primary Physician/Referring:  Kathalene Frames, MD  Patient ID: Kristen Figueroa, female    DOB: [birth year], 77 y.o.   MRN: [id number]  Chief Complaint  Patient presents with   Hypertension   Follow-up   HPI:    Kristen Figueroa  is a 77 y.o. female with past medical history significant for hypertension, hyperlipidemia, and hypothyroidism who is here for a follow-up visit. Patient has been doing well. She says she feels great and has no complaints today.  Denies chest pain, shortness of breath, palpitations, diaphoresis, syncope, edema, PND, orthopnea.   Past Medical History:  Diagnosis Date   Allergy    RHINITIS   Arthritis    Bradycardia    Dyslipidemia    Headache    Hypothyroidism    Vision abnormalities    Past Surgical History:  Procedure Laterality Date   DENTAL SURGERY     HIP ARTHROPLASTY Left    left hip replacement     NECK LIFT     TUBAL LIGATION     TUBAL LIGATION     UPPER GASTROINTESTINAL ENDOSCOPY  09/07/2010   Family History  Problem Relation Age of Onset   Bone cancer Mother        unknown origin mets to bones   Thyroid nodules Mother    Heart disease Father    Hypertension Father    Thyroid disease Sister    Heart disease Brother    Glaucoma Maternal Grandfather    Diabetes Paternal Grandfather    Thyroid disease Nephew    Thyroid disease Niece    Thyroid disease Other        number of family members    Social History   Tobacco Use   Smoking status: Former    Packs/day: 0.50    Years: 40.00    Total pack years: 20.00    Types: Cigarettes    Quit date: 2000    Years since quitting: 24.1   Smokeless tobacco: Never  Substance Use Topics   Alcohol use: Yes    Alcohol/week: 4.0 standard drinks of alcohol    Types: 4 Glasses of wine per week    Comment: occ   Marital Status: Married  ROS  Review of Systems  Cardiovascular:  Negative for chest pain, claudication, irregular heartbeat, leg swelling, palpitations and paroxysmal nocturnal  dyspnea.  Musculoskeletal:  Positive for neck pain.   Objective  Blood pressure (!) 114/56, pulse (!) 58, height 5\' 7"  (1.702 m), weight 154 lb (69.9 kg), SpO2 98 %. Body mass index is 24.12 kg/m.     12/14/2022    8:55 AM 09/12/2022    8:35 AM 10/24/2020    2:00 PM  Vitals with BMI  Height 5\' 7"  5\' 7"    Weight 154 lbs 150 lbs   BMI 33.29 51.88   Systolic 416 606 301  Diastolic 56 60 60  Pulse 58 66 59     Physical Exam Vitals reviewed.  HENT:     Head: Normocephalic and atraumatic.  Neck:     Vascular: No carotid bruit.  Cardiovascular:     Rate and Rhythm: Normal rate and regular rhythm.     Pulses: Normal pulses.     Heart sounds: Normal heart sounds. No murmur heard. Pulmonary:     Effort: Pulmonary effort is normal.     Breath sounds: Normal breath sounds.  Musculoskeletal:     Right lower leg: No edema.     Left lower leg: No  edema.  Skin:    General: Skin is warm and dry.  Neurological:     Mental Status: She is alert.     Medications and allergies   Allergies  Allergen Reactions   Codeine Other (See Comments)    REACTION: stomach cramps, swelling   Levofloxacin Nausea Only   Tetanus Antitoxin     Other reaction(s): Unknown     Medication list after today's encounter   Current Outpatient Medications:    acetaminophen (TYLENOL) 500 MG tablet, Take 500 mg by mouth every 6 (six) hours as needed for mild pain., Disp: , Rfl:    Cholecalciferol (VITAMIN D3) 125 MCG (5000 UT) CAPS, Take 5,000 Units by mouth daily., Disp: , Rfl:    escitalopram (LEXAPRO) 10 MG tablet, TAKE 1 TABLET(10 MG) BY MOUTH DAILY, Disp: 90 tablet, Rfl: 0   fluticasone (FLONASE ALLERGY RELIEF) 50 MCG/ACT nasal spray, 1 spray in each nostril Nasally Once a day for 30 day(s), Disp: , Rfl:    levothyroxine (SYNTHROID, LEVOTHROID) 50 MCG tablet, Take 50 mcg by mouth daily., Disp: , Rfl:    Magnesium 400 MG CAPS, Take 400 mg by mouth at bedtime., Disp: , Rfl:    omeprazole (PRILOSEC) 20 MG  capsule, Take 20 mg by mouth daily. , Disp: , Rfl:    telmisartan (MICARDIS) 40 MG tablet, Take 40 mg by mouth daily., Disp: , Rfl:    amoxicillin (AMOXIL) 500 MG capsule, Take 2,000 mg by mouth See admin instructions. Take 1 hour prior to dental appointments (Patient not taking: Reported on 09/12/2022), Disp: , Rfl: 2  Laboratory examination:   Lab Results  Component Value Date   NA 136 10/24/2020   K 4.0 10/24/2020   CO2 24 10/24/2020   GLUCOSE 124 (H) 10/24/2020   BUN 11 10/24/2020   CREATININE 1.08 (H) 10/24/2020   CALCIUM 8.7 (L) 10/24/2020   GFRNONAA 54 (L) 10/24/2020       Latest Ref Rng & Units 10/24/2020    8:10 AM 09/25/2018    5:32 PM 01/21/2011    5:40 AM  CMP  Glucose 70 - 99 mg/dL 124  89  119   BUN 8 - 23 mg/dL 11  18  6    Creatinine 0.44 - 1.00 mg/dL 1.08  1.08  0.84   Sodium 135 - 145 mmol/L 136  142  136   Potassium 3.5 - 5.1 mmol/L 4.0  4.1  3.5   Chloride 98 - 111 mmol/L 100  107  102   CO2 22 - 32 mmol/L 24  30  29    Calcium 8.9 - 10.3 mg/dL 8.7  9.3  8.2       Latest Ref Rng & Units 10/24/2020    8:10 AM 09/25/2018    5:32 PM 01/21/2011    5:40 AM  CBC  WBC 4.0 - 10.5 K/uL 5.8  5.8  6.4   Hemoglobin 12.0 - 15.0 g/dL 11.9  13.0  8.2   Hematocrit 36.0 - 46.0 % 36.9  40.2  24.8   Platelets 150 - 400 K/uL 141  199  174     Lipid Panel No results for input(s): "CHOL", "TRIG", "LDLCALC", "VLDL", "HDL", "CHOLHDL", "LDLDIRECT" in the last 8760 hours.  HEMOGLOBIN A1C No results found for: "HGBA1C", "MPG" TSH No results for input(s): "TSH" in the last 8760 hours.  External labs:     Radiology:    Cardiac Studies:   Echocardiogram 09/19/2022:  Normal LV systolic function with visual EF 60-65%.  Left ventricle cavity  is normal in size. Normal left ventricular wall thickness. Normal global  wall motion. Doppler evidence of grade I (impaired) diastolic dysfunction,  normal LAP. Calculated EF 74%.  Structurally normal trileaflet aortic valve.   Trace aortic regurgitation.  Structurally normal tricuspid valve.  Mild tricuspid regurgitation. No  evidence of pulmonary hypertension.  No prior available for comparison.    EKG:   09/12/2022: NSR, normal axis, normal R wave progression, no evidence of ischemia  Assessment     ICD-10-CM   1. Primary hypertension  I10     2. Mixed hyperlipidemia  E78.2        No orders of the defined types were placed in this encounter.   No orders of the defined types were placed in this encounter.   There are no discontinued medications.    Recommendations:   Kristen Figueroa is a 77 y.o.  F with HTN and HLD  Primary hypertension Continue current cardiac medications. Encourage low-sodium diet, less than 2000 mg daily. Follow-up in 12 months or sooner if needed   Mixed hyperlipidemia Encourage patient to start statin Lipids followed by PCP      Floydene Flock, DO, [facility]  12/14/2022, 2:39 PM Office: [phone] Pager: [phone]

## 2022-12-21 DIAGNOSIS — K080 Exfoliation of teeth due to systemic causes: Secondary | ICD-10-CM | POA: Diagnosis not present

## 2022-12-26 DIAGNOSIS — Z885 Allergy status to narcotic agent status: Secondary | ICD-10-CM | POA: Diagnosis not present

## 2022-12-26 DIAGNOSIS — H359 Unspecified retinal disorder: Secondary | ICD-10-CM | POA: Diagnosis not present

## 2022-12-26 DIAGNOSIS — H353130 Nonexudative age-related macular degeneration, bilateral, stage unspecified: Secondary | ICD-10-CM | POA: Diagnosis not present

## 2022-12-26 DIAGNOSIS — Z961 Presence of intraocular lens: Secondary | ICD-10-CM | POA: Diagnosis not present

## 2022-12-26 DIAGNOSIS — H353132 Nonexudative age-related macular degeneration, bilateral, intermediate dry stage: Secondary | ICD-10-CM | POA: Diagnosis not present

## 2022-12-26 DIAGNOSIS — H43812 Vitreous degeneration, left eye: Secondary | ICD-10-CM | POA: Diagnosis not present

## 2023-01-02 DIAGNOSIS — J069 Acute upper respiratory infection, unspecified: Secondary | ICD-10-CM | POA: Diagnosis not present

## 2023-02-01 DIAGNOSIS — E049 Nontoxic goiter, unspecified: Secondary | ICD-10-CM | POA: Diagnosis not present

## 2023-02-01 DIAGNOSIS — K219 Gastro-esophageal reflux disease without esophagitis: Secondary | ICD-10-CM | POA: Diagnosis not present

## 2023-02-01 DIAGNOSIS — E7800 Pure hypercholesterolemia, unspecified: Secondary | ICD-10-CM | POA: Diagnosis not present

## 2023-02-01 DIAGNOSIS — I10 Essential (primary) hypertension: Secondary | ICD-10-CM | POA: Diagnosis not present

## 2023-02-27 DIAGNOSIS — J343 Hypertrophy of nasal turbinates: Secondary | ICD-10-CM | POA: Diagnosis not present

## 2023-02-27 DIAGNOSIS — J310 Chronic rhinitis: Secondary | ICD-10-CM | POA: Diagnosis not present

## 2023-03-19 DIAGNOSIS — K080 Exfoliation of teeth due to systemic causes: Secondary | ICD-10-CM | POA: Diagnosis not present

## 2023-05-09 DIAGNOSIS — K080 Exfoliation of teeth due to systemic causes: Secondary | ICD-10-CM | POA: Diagnosis not present

## 2023-06-19 DIAGNOSIS — G4486 Cervicogenic headache: Secondary | ICD-10-CM | POA: Diagnosis not present

## 2023-06-19 DIAGNOSIS — M542 Cervicalgia: Secondary | ICD-10-CM | POA: Diagnosis not present

## 2023-06-19 DIAGNOSIS — N1831 Chronic kidney disease, stage 3a: Secondary | ICD-10-CM | POA: Diagnosis not present

## 2023-06-19 DIAGNOSIS — G9001 Carotid sinus syncope: Secondary | ICD-10-CM | POA: Diagnosis not present

## 2023-06-26 DIAGNOSIS — R519 Headache, unspecified: Secondary | ICD-10-CM | POA: Diagnosis not present

## 2023-06-26 DIAGNOSIS — M542 Cervicalgia: Secondary | ICD-10-CM | POA: Diagnosis not present

## 2023-07-06 DIAGNOSIS — B029 Zoster without complications: Secondary | ICD-10-CM | POA: Diagnosis not present

## 2023-07-10 DIAGNOSIS — B029 Zoster without complications: Secondary | ICD-10-CM | POA: Diagnosis not present

## 2023-07-30 DIAGNOSIS — R519 Headache, unspecified: Secondary | ICD-10-CM | POA: Diagnosis not present

## 2023-08-06 DIAGNOSIS — G43109 Migraine with aura, not intractable, without status migrainosus: Secondary | ICD-10-CM | POA: Diagnosis not present

## 2023-08-14 ENCOUNTER — Other Ambulatory Visit: Payer: Self-pay | Admitting: Internal Medicine

## 2023-08-14 DIAGNOSIS — E042 Nontoxic multinodular goiter: Secondary | ICD-10-CM

## 2023-08-15 DIAGNOSIS — G43719 Chronic migraine without aura, intractable, without status migrainosus: Secondary | ICD-10-CM | POA: Diagnosis not present

## 2023-08-15 DIAGNOSIS — Z049 Encounter for examination and observation for unspecified reason: Secondary | ICD-10-CM | POA: Diagnosis not present

## 2023-08-15 DIAGNOSIS — M542 Cervicalgia: Secondary | ICD-10-CM | POA: Diagnosis not present

## 2023-08-23 DIAGNOSIS — E039 Hypothyroidism, unspecified: Secondary | ICD-10-CM | POA: Diagnosis not present

## 2023-08-23 DIAGNOSIS — E042 Nontoxic multinodular goiter: Secondary | ICD-10-CM | POA: Diagnosis not present

## 2023-09-05 ENCOUNTER — Ambulatory Visit
Admission: RE | Admit: 2023-09-05 | Discharge: 2023-09-05 | Disposition: A | Discharge status: Home / Self Care | Payer: Medicare Other | Source: Ambulatory Visit | Attending: Internal Medicine | Admitting: Internal Medicine

## 2023-09-05 DIAGNOSIS — E042 Nontoxic multinodular goiter: Secondary | ICD-10-CM

## 2023-10-29 DIAGNOSIS — G518 Other disorders of facial nerve: Secondary | ICD-10-CM | POA: Diagnosis not present

## 2023-10-29 DIAGNOSIS — G43719 Chronic migraine without aura, intractable, without status migrainosus: Secondary | ICD-10-CM | POA: Diagnosis not present

## 2023-10-29 DIAGNOSIS — M791 Myalgia, unspecified site: Secondary | ICD-10-CM | POA: Diagnosis not present

## 2023-10-29 DIAGNOSIS — M542 Cervicalgia: Secondary | ICD-10-CM | POA: Diagnosis not present

## 2023-11-26 DIAGNOSIS — L812 Freckles: Secondary | ICD-10-CM | POA: Diagnosis not present

## 2023-11-26 DIAGNOSIS — L820 Inflamed seborrheic keratosis: Secondary | ICD-10-CM | POA: Diagnosis not present

## 2023-11-26 DIAGNOSIS — L821 Other seborrheic keratosis: Secondary | ICD-10-CM | POA: Diagnosis not present

## 2023-11-27 DIAGNOSIS — G43719 Chronic migraine without aura, intractable, without status migrainosus: Secondary | ICD-10-CM | POA: Diagnosis not present

## 2023-11-27 DIAGNOSIS — K080 Exfoliation of teeth due to systemic causes: Secondary | ICD-10-CM | POA: Diagnosis not present

## 2023-11-27 DIAGNOSIS — G518 Other disorders of facial nerve: Secondary | ICD-10-CM | POA: Diagnosis not present

## 2023-11-27 DIAGNOSIS — M791 Myalgia, unspecified site: Secondary | ICD-10-CM | POA: Diagnosis not present

## 2023-11-27 DIAGNOSIS — M542 Cervicalgia: Secondary | ICD-10-CM | POA: Diagnosis not present

## 2023-11-28 DIAGNOSIS — I10 Essential (primary) hypertension: Secondary | ICD-10-CM | POA: Diagnosis not present

## 2023-11-28 DIAGNOSIS — M542 Cervicalgia: Secondary | ICD-10-CM | POA: Diagnosis not present

## 2023-11-28 DIAGNOSIS — N1831 Chronic kidney disease, stage 3a: Secondary | ICD-10-CM | POA: Diagnosis not present

## 2023-11-28 DIAGNOSIS — Z0000 Encounter for general adult medical examination without abnormal findings: Secondary | ICD-10-CM | POA: Diagnosis not present

## 2023-11-28 DIAGNOSIS — Z1331 Encounter for screening for depression: Secondary | ICD-10-CM | POA: Diagnosis not present

## 2023-11-28 DIAGNOSIS — E039 Hypothyroidism, unspecified: Secondary | ICD-10-CM | POA: Diagnosis not present

## 2023-11-28 DIAGNOSIS — E7800 Pure hypercholesterolemia, unspecified: Secondary | ICD-10-CM | POA: Diagnosis not present

## 2023-11-28 DIAGNOSIS — K219 Gastro-esophageal reflux disease without esophagitis: Secondary | ICD-10-CM | POA: Diagnosis not present

## 2023-12-06 DIAGNOSIS — Z23 Encounter for immunization: Secondary | ICD-10-CM | POA: Diagnosis not present

## 2024-03-28 DIAGNOSIS — H5212 Myopia, left eye: Secondary | ICD-10-CM | POA: Diagnosis not present

## 2024-05-22 DIAGNOSIS — J069 Acute upper respiratory infection, unspecified: Secondary | ICD-10-CM | POA: Diagnosis not present

## 2024-06-19 DIAGNOSIS — N1831 Chronic kidney disease, stage 3a: Secondary | ICD-10-CM | POA: Diagnosis not present

## 2024-06-19 DIAGNOSIS — E039 Hypothyroidism, unspecified: Secondary | ICD-10-CM | POA: Diagnosis not present

## 2024-06-19 DIAGNOSIS — R682 Dry mouth, unspecified: Secondary | ICD-10-CM | POA: Diagnosis not present

## 2024-06-19 DIAGNOSIS — I10 Essential (primary) hypertension: Secondary | ICD-10-CM | POA: Diagnosis not present

## 2024-06-19 DIAGNOSIS — K219 Gastro-esophageal reflux disease without esophagitis: Secondary | ICD-10-CM | POA: Diagnosis not present

## 2024-08-08 DIAGNOSIS — Z1211 Encounter for screening for malignant neoplasm of colon: Secondary | ICD-10-CM | POA: Diagnosis not present

## 2024-08-08 DIAGNOSIS — K219 Gastro-esophageal reflux disease without esophagitis: Secondary | ICD-10-CM | POA: Diagnosis not present

## 2024-08-08 DIAGNOSIS — R6889 Other general symptoms and signs: Secondary | ICD-10-CM | POA: Diagnosis not present

## 2024-08-26 DIAGNOSIS — E039 Hypothyroidism, unspecified: Secondary | ICD-10-CM | POA: Diagnosis not present

## 2024-08-26 DIAGNOSIS — E042 Nontoxic multinodular goiter: Secondary | ICD-10-CM | POA: Diagnosis not present

## 2024-09-02 ENCOUNTER — Ambulatory Visit (INDEPENDENT_AMBULATORY_CARE_PROVIDER_SITE_OTHER)

## 2024-09-02 ENCOUNTER — Encounter (INDEPENDENT_AMBULATORY_CARE_PROVIDER_SITE_OTHER): Payer: Self-pay

## 2024-09-02 VITALS — BP 128/68 | HR 58 | Temp 98.1°F | Ht 67.0 in | Wt 148.5 lb

## 2024-09-02 DIAGNOSIS — H9193 Unspecified hearing loss, bilateral: Secondary | ICD-10-CM | POA: Diagnosis not present

## 2024-09-02 DIAGNOSIS — R490 Dysphonia: Secondary | ICD-10-CM | POA: Diagnosis not present

## 2024-09-02 DIAGNOSIS — J358 Other chronic diseases of tonsils and adenoids: Secondary | ICD-10-CM | POA: Diagnosis not present

## 2024-09-02 NOTE — Progress Notes (Signed)
 Dear Dr. Charlott, Here is my assessment for our mutual patient, Kristen Figueroa. Thank you for allowing me the opportunity to care for your patient. Please do not hesitate to contact me should you have any other questions. Sincerely, Dr. Penne Croak  Otolaryngology Clinic Note Referring provider: Dr. Charlott HPI:  Discussed the use of AI scribe software for clinical note transcription with the patient, who gave verbal consent to proceed. History of Present Illness Kristen Figueroa is a 78 year old female with chronic acid reflux who presents with persistent throat discomfort and hoarseness. She was referred by Dr. Charlott for evaluation of persistent throat discomfort and hoarseness.  Throat discomfort and hoarseness - Persistent throat discomfort and hoarseness for approximately two months - Sensation of dryness in the throat, particularly on the left side - Describes sensation as 'a popcorn stuck in my throat', which improves slightly by midday but remains bothersome - No significant pain, only mild aggravation - No bleeding in the mouth  Oropharyngeal abnormality - Visible 'hole' in the left tonsil noticed on self-examination - Sensation of something stuck in the throat persisting for two months  Oral and perioral dryness - Applies Aquaflor on lips at night to manage dryness - Describes dryness as 'like I had drunk milk and it all kind of dried around my mouth' - Crusting around lips associated with reflux symptoms  Gastroesophageal reflux symptoms and management - Long-standing acid reflux managed with daily morning omeprazole - Occasionally takes omeprazole at night for symptom control - Symptoms exacerbated by certain foods, such as tomatoes and strawberries, which she avoids - Recent worsening of reflux symptoms, not controlled with usual adjustments  Tobacco use history - Quit smoking approximately 25 years ago  Independent Review of Additional Tests or Records:   Reviewed external note from referring PCP, [place],describing RElevant history incorporated into today's evaluation.   PMH/Meds/All/SocHx/FamHx/ROS:   Past Medical History:  Diagnosis Date   Allergy    RHINITIS   Arthritis    Bradycardia    Dyslipidemia    Headache    Hypothyroidism    Vision abnormalities      Past Surgical History:  Procedure Laterality Date   DENTAL SURGERY     HIP ARTHROPLASTY Left    left hip replacement     NECK LIFT     TUBAL LIGATION     TUBAL LIGATION     UPPER GASTROINTESTINAL ENDOSCOPY  09/07/2010    Family History  Problem Relation Age of Onset   Bone cancer Mother        unknown origin mets to bones   Thyroid  nodules Mother    Heart disease Father    Hypertension Father    Thyroid  disease Sister    Heart disease Brother    Glaucoma Maternal Grandfather    Diabetes Paternal Grandfather    Thyroid  disease Nephew    Thyroid  disease Niece    Thyroid  disease Other        number of family members     Social Connections: Unknown (03/27/2022)   Received from [employer]   Social Network    Social Network: Not on file      Current Outpatient Medications:    acetaminophen  (TYLENOL ) 500 MG tablet, Take 500 mg by mouth every 6 (six) hours as needed for mild pain., Disp: , Rfl:    Cholecalciferol  (VITAMIN D3) 125 MCG (5000 UT) CAPS, Take 5,000 Units by mouth daily., Disp: , Rfl:    escitalopram  (LEXAPRO ) 10 MG tablet,  TAKE 1 TABLET(10 MG) BY MOUTH DAILY, Disp: 90 tablet, Rfl: 0   fluticasone (FLONASE ALLERGY RELIEF) 50 MCG/ACT nasal spray, 1 spray in each nostril Nasally Once a day for 30 day(s), Disp: , Rfl:    levothyroxine  (SYNTHROID , LEVOTHROID) 50 MCG tablet, Take 50 mcg by mouth daily., Disp: , Rfl:    Magnesium 400 MG CAPS, Take 400 mg by mouth at bedtime., Disp: , Rfl:    omeprazole (PRILOSEC) 20 MG capsule, Take 20 mg by mouth daily. Patient says that she sometimes takes an extra, Disp: , Rfl:    telmisartan (MICARDIS) 40 MG  tablet, Take 40 mg by mouth daily., Disp: , Rfl:    amoxicillin (AMOXIL) 500 MG capsule, Take 2,000 mg by mouth See admin instructions. Take 1 hour prior to dental appointments (Patient not taking: Reported on 09/02/2024), Disp: , Rfl: 2   Physical Exam:   BP 128/68   Pulse (!) 58   Temp 98.1 F (36.7 C)   Ht 5' 7 (1.702 m)   Wt 148 lb 8 oz (67.4 kg)   SpO2 98%   BMI 23.26 kg/m   The patient was awake, alert, and appropriate. The external ears were inspected, and otoscopy was performed to evaluate the external auditory canals and tympanic membranes. The nasal cavity and septum were examined for mucosal changes, obstruction, or discharge. The oral cavity and oropharynx were inspected for mucosal lesions, infection, or tonsillar hypertrophy. The neck was palpated for lymphadenopathy, thyroid  abnormalities, or other masses. Cranial nerve function was grossly intact.  Pertinent Findings: Physical Exam HEENT: Asymmetry between tonsils with a possible lesion on the left tonsil. Right ear canal clear without cerumen impaction. Nasal passages normal without obstruction. NECK: Neck without masses or lymphadenopathy on palpation.     Seprately Identifiable Procedures:  I personally ordered, reviewed and interpreted the following with the patient today  Prior to initiating any procedures, risks/benefits/alternatives were explained to the patient and verbal consent obtained. None  Impression & Plans:  Kristen Figueroa is a 78 y.o. female  1. Hoarse voice quality   2. Tonsillar mass   3. Bilateral hearing loss, unspecified hearing loss type    - Findings and diagnoses discussed in detail with the patient. - Risks, benefits, and alternatives were reviewed. Through shared decision making, the patient elects to proceed with below. 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 Biopsy needed for diagnosis. No neck masses noted,  reducing concern for metastasis.  Offered in office biopsy vs sedation. After discussion of risks of pain and bleeding, patient preferred to have the biopsy completed under anesthesia.   - Schedule direct laryngoscopy with biopsy under anesthesia to obtain sample and potentially excise lesion. - Order CT scan to assess lesion depth before procedure. - Discussed potential outcomes: excision if resectable - Explained risks of tonsillectomy, including severe sore throat and need for hydration. - Plan procedure after November 10th due to her commitments.  Chronic dry mouth and throat Chronic dryness possibly related to GERD and dietary factors. - Encourage hydration and use of Aquaphor on lips before bed. - Monitor symptoms and adjust GERD management as needed.  Gastroesophageal reflux disease (GERD) Chronic GERD managed with omeprazole. Hoarseness and lip crusting likely due to acid reflux, exacerbated by certain foods. - Continue omeprazole, consider nighttime dosing if symptoms persist. - Avoid dietary triggers such as tomatoes, strawberries, and pineapple.  - Orders placed:  Orders Placed This Encounter  Procedures  CT SOFT TISSUE NECK W CONTRAST   Ambulatory referral to Audiology   - Medications prescribed/continued/adjusted: No orders of the defined types were placed in this encounter.  - Education materials provided to the patient. - Patient instructed to return sooner or go to the ED if new/worsening symptoms develop.  Thank you for allowing me the opportunity to care for your patient. Please do not hesitate to contact me should you have any other questions.  Sincerely, Penne Croak, DO Otolaryngologist (ENT) [facility] Phone: [phone] Fax: [phone]  09/02/2024, 3:19 PM

## 2024-09-09 ENCOUNTER — Ambulatory Visit (INDEPENDENT_AMBULATORY_CARE_PROVIDER_SITE_OTHER)

## 2024-09-11 DIAGNOSIS — E040 Nontoxic diffuse goiter: Secondary | ICD-10-CM | POA: Diagnosis not present

## 2024-09-11 DIAGNOSIS — K219 Gastro-esophageal reflux disease without esophagitis: Secondary | ICD-10-CM | POA: Diagnosis not present

## 2024-10-03 ENCOUNTER — Telehealth (INDEPENDENT_AMBULATORY_CARE_PROVIDER_SITE_OTHER): Payer: Self-pay

## 2024-10-03 NOTE — Telephone Encounter (Signed)
 10/03/24 Called patient per request for Dr. Anice to schedule Tonsil Biopsy procedure, patient stated she had canceled the appointment due to having an imaging done. I explained to the patient that Dr. Anice wanted to reschedule her canceled appointment as soon as possible, she stated she will call back after Thanksgiving.   Dr. Anice is aware of the refusal.

## 2024-10-06 NOTE — Progress Notes (Unsigned)
 Cardiology Office Note:   Date:  10/07/2024  ID:  Kristen Figueroa, DOB September 18, 1946, MRN [id number] PCP:  [name] LABOR, MD  [facility] Providers Cardiologist:  Wendel Haws, MD Referring MD: [name] LABOR, *  Chief Complaint/Reason for Referral: Follow-up coronary artery calcification, diastolic dysfunction ASSESSMENT:    1. Precordial pain   2. Coronary artery calcification seen on CAT scan   3. Diastolic dysfunction   4. Primary hypertension   5. Hyperlipidemia LDL goal <70   6. Aortic atherosclerosis   7. Stage 3a chronic kidney disease (HCC)   8. Syncope, unspecified syncope type     PLAN:   In order of problems listed above: Chest pain: We will obtain a coronary CTA and echocardiogram to evaluate further.  If the patient has mild obstructive coronary artery disease, they will require a statin (with goal LDL < 70) and aspirin , if they have high-grade disease we will need to consider optimal medical therapy and if symptoms are refractory to medical therapy, then a cardiac catheterization with possible PCI will be pursued to alleviate symptoms.  If they have high risk disease we will proceed directly to cardiac catheterization.   Syncope:  Will obtain 30-day monitor and echocardiogram. Coronary calcification: Start aspirin  81 mg, start atorvastatin  20 mg. Diastolic dysfunction: Increase telmisartan  to 80 mg as below Hypertension: Increase telmisartan  to 80 mg, check BMP next week. Hyperlipidemia: Start atorvastatin  20 mg, check lipid panel, LFTs, LP(a) in 2 months CKD stage IIIa: Increase telmisartan  to 80 mg as above; consider SGLT2 inhibitor in the future            Dispo:  Return in about 3 months (around 01/07/2025).       I spent 42 minutes reviewing all clinical data during and prior to this visit including all relevant imaging studies, laboratories, clinical information from other health systems and prior notes from both Cardiology and other  specialties, interviewing the patient, conducting a complete physical examination, and coordinating care in order to formulate a comprehensive and personalized evaluation and treatment plan.   History of Present Illness:    FOCUSED PROBLEM LIST:   Coronary artery calcification  Calcium  score CT 2022 Diastolic dysfunction G1 DD, no significant valve issues, EF 60 to 65% TTE 2023 Hypertension Hyperlipidemia Aortic atherosclerosis CT abdomen pelvis 2017 CKD stage IIIa BMI 06 October 2024:  Patient consents to use of AI scribe. The patient is 78 year old female with above listed medical problems referred to establish cardiovascular care.  She was last seen by [facility] in 2024.  She was doing well and no changes were made to her medical regimen.  She experiences chest pain described as pressure-like, primarily occurring after eating or during intense physical activity. She has a history of acid reflux, complicating the differentiation between cardiac and gastrointestinal pain. The pain resolves spontaneously.  She has had two episodes of syncope, one while reaching up to adjust a thermostat and another after a shower while applying oil. During these episodes, she experienced severe spasms starting from her head and radiating upwards, which she associates with stress. She has been evaluated by a headache specialist but remains uncertain about the cause of these episodes.  No bad bleeding, bruising, or noticeable swelling. No significant issues with breathing.     Current Medications: Current Meds  Medication Sig   acetaminophen  (TYLENOL ) 500 MG tablet Take 500 mg by mouth every 6 (six) hours as needed for mild pain.   aspirin  EC  81 MG tablet Take 1 tablet (81 mg total) by mouth daily. Swallow whole.   atorvastatin  (LIPITOR) 20 MG tablet Take 1 tablet (20 mg total) by mouth daily.   Cholecalciferol  (VITAMIN D3) 125 MCG (5000 UT) CAPS Take 5,000 Units by mouth daily.    escitalopram  (LEXAPRO ) 10 MG tablet TAKE 1 TABLET(10 MG) BY MOUTH DAILY   fluticasone (FLONASE ALLERGY RELIEF) 50 MCG/ACT nasal spray 1 spray in each nostril Nasally Once a day for 30 day(s)   levothyroxine  (SYNTHROID , LEVOTHROID) 50 MCG tablet Take 50 mcg by mouth daily.   metoprolol  tartrate (LOPRESSOR ) 25 MG tablet Take 1 tablet (25 mg total) by mouth once for 1 dose. Take 90-120 minutes prior to scan. Hold for SBP less than 110.   Multiple Vitamin (MULTIVITAMIN) tablet Take 1 tablet by mouth daily.   pantoprazole  (PROTONIX ) 40 MG tablet Take 40 mg by mouth daily.   telmisartan  (MICARDIS ) 80 MG tablet Take 1 tablet (80 mg total) by mouth daily.   vitamin B-12 (CYANOCOBALAMIN ) 100 MCG tablet Take 100 mcg by mouth as needed.   [DISCONTINUED] amoxicillin (AMOXIL) 500 MG capsule Take 2,000 mg by mouth See admin instructions. Take 1 hour prior to dental appointments   [DISCONTINUED] Magnesium 400 MG CAPS Take 400 mg by mouth at bedtime.   [DISCONTINUED] omeprazole (PRILOSEC) 20 MG capsule Take 20 mg by mouth daily. Patient says that she sometimes takes an extra   [DISCONTINUED] telmisartan  (MICARDIS ) 40 MG tablet Take 40 mg by mouth daily.     Review of Systems:   Please see the history of present illness.    All other systems reviewed and are negative.     EKGs/Labs/Other Test Reviewed:   EKG: 2023 normal sinus rhythm  EKG Interpretation Date/Time:  Tuesday October 07 2024 09:22:51 EST Ventricular Rate:  57 PR Interval:  154 QRS Duration:  80 QT Interval:  424 QTC Calculation: 412 R Axis:   54  Text Interpretation: Sinus bradycardia When compared with ECG of 24-Oct-2020 08:05, No significant change was found Confirmed by Wendel Haws ([id number]) on 10/07/2024 9:27:08 AM        CARDIAC STUDIES: Refer to CV Procedures and Imaging Tabs   Risk Assessment/Calculations:          Physical Exam:   VS:  BP 132/80 (BP Location: Right Arm, Patient Position: Sitting, Cuff Size:  Normal)   Pulse (!) 57   Ht 5' 7 (1.702 m)   Wt 155 lb (70.3 kg)   SpO2 98%   BMI 24.28 kg/m        Wt Readings from Last 3 Encounters:  10/07/24 155 lb (70.3 kg)  09/02/24 148 lb 8 oz (67.4 kg)  12/14/22 154 lb (69.9 kg)      GENERAL:  No apparent distress, AOx3 HEENT:  No carotid bruits, +2 carotid impulses, no scleral icterus CAR: RRR no murmurs, gallops, rubs, or thrills RES:  Clear to auscultation bilaterally ABD:  Soft, nontender, nondistended, positive bowel sounds x 4 VASC:  +2 radial pulses, +2 carotid pulses NEURO:  CN 2-12 grossly intact; motor and sensory grossly intact PSYCH:  No active depression or anxiety EXT:  No edema, ecchymosis, or cyanosis  Signed, Haws MARLA Wendel, MD  10/07/2024 10:01 AM    [facility] [address] [place], [place], [place]  [address] Phone: [phone]; Fax: [phone]   Note:  This document was prepared using Dragon voice recognition software and may include unintentional dictation errors.

## 2024-10-07 ENCOUNTER — Ambulatory Visit: Attending: Internal Medicine | Admitting: Internal Medicine

## 2024-10-07 ENCOUNTER — Encounter: Payer: Self-pay | Admitting: Internal Medicine

## 2024-10-07 VITALS — BP 132/80 | HR 57 | Ht 67.0 in | Wt 155.0 lb

## 2024-10-07 DIAGNOSIS — I5189 Other ill-defined heart diseases: Secondary | ICD-10-CM

## 2024-10-07 DIAGNOSIS — R072 Precordial pain: Secondary | ICD-10-CM | POA: Diagnosis not present

## 2024-10-07 DIAGNOSIS — I2510 Atherosclerotic heart disease of native coronary artery without angina pectoris: Secondary | ICD-10-CM | POA: Diagnosis not present

## 2024-10-07 DIAGNOSIS — I700 Atherosclerosis of aorta: Secondary | ICD-10-CM

## 2024-10-07 DIAGNOSIS — E785 Hyperlipidemia, unspecified: Secondary | ICD-10-CM

## 2024-10-07 DIAGNOSIS — I10 Essential (primary) hypertension: Secondary | ICD-10-CM

## 2024-10-07 DIAGNOSIS — R55 Syncope and collapse: Secondary | ICD-10-CM

## 2024-10-07 DIAGNOSIS — N1831 Chronic kidney disease, stage 3a: Secondary | ICD-10-CM

## 2024-10-07 MED ORDER — ASPIRIN 81 MG PO TBEC: 81.0000 mg | DELAYED_RELEASE_TABLET | Freq: Every day | ORAL | Status: DC

## 2024-10-07 MED ORDER — METOPROLOL TARTRATE 25 MG PO TABS: 25.0000 mg | ORAL_TABLET | Freq: Once | ORAL | 0 refills | Status: DC

## 2024-10-07 MED ORDER — TELMISARTAN 80 MG PO TABS: 80.0000 mg | ORAL_TABLET | Freq: Every day | ORAL | 3 refills | Status: DC

## 2024-10-07 MED ORDER — ATORVASTATIN CALCIUM 20 MG PO TABS: 20.0000 mg | ORAL_TABLET | Freq: Every day | ORAL | 3 refills | Status: AC

## 2024-10-07 NOTE — Patient Instructions (Addendum)
 Medication Instructions:  START Aspirin  81 mg once daily   START Atorvastatin  (Lipitor) 20 mg once daily  INCREASE Telmisartan  (Micardis ) to 80 mg once daily   ONCE Metoprolol  Tartrate (Lopressor ) 25 mg 2 hours prior to your coronary CTA scan.  *If you need a refill on your cardiac medications before your next appointment, please call your pharmacy*  Lab Work: To be completed in 1 week: BMP  To be completed in 2 months: lipid panel, LFT, lipoprotein-a  If you have labs (blood work) drawn today and your tests are completely normal, you will receive your results only by: MyChart Message (if you have MyChart) OR A paper copy in the mail If you have any lab test that is abnormal or we need to change your treatment, we will call you to review the results.  Testing/Procedures: Your provider has requested that you have a coronary CTA scan. Non-Cardiac CT Angiography (CTA), is a special type of CT scan that uses a computer to produce multi-dimensional views of major blood vessels throughout the body. In CT angiography, a contrast material is injected through an IV to help visualize the blood vessels  Your physician has requested that you have an echocardiogram. Echocardiography is a painless test that uses sound waves to create images of your heart. It provides your doctor with information about the size and shape of your heart and how well your heart's chambers and valves are working. This procedure takes approximately one hour. There are no restrictions for this procedure. Please do NOT wear cologne, perfume, aftershave, or lotions (deodorant is allowed). Please arrive 15 minutes prior to your appointment time.  Please note: We ask at that you not bring children with you during ultrasound (echo/ vascular) testing. Due to room size and safety concerns, children are not allowed in the ultrasound rooms during exams. Our front office staff cannot provide observation of children in our lobby area  while testing is being conducted. An adult accompanying a patient to their appointment will only be allowed in the ultrasound room at the discretion of the ultrasound technician under special circumstances. We apologize for any inconvenience.   Your provider has requested that you have a 30 day cardiac event monitor. This will be sent to you in the mail. If you have any questions about the monitor or how to place it, call our office at [phone].  Follow-Up: At [facility], you and your health needs are our priority.  As part of our continuing mission to provide you with exceptional heart care, our providers are all part of one team.  This team includes your primary Cardiologist (physician) and Advanced Practice Providers or APPs (Physician Assistants and Nurse Practitioners) who all work together to provide you with the care you need, when you need it.  Your next appointment:   3 month(s)  Provider:   Lurena Red, MD    We recommend signing up for the patient portal called MyChart.  Sign up information is provided on this After Visit Summary.  MyChart is used to connect with patients for Virtual Visits (Telemedicine).  Patients are able to view lab/test results, encounter notes, upcoming appointments, etc.  Non-urgent messages can be sent to your provider as well.   To learn more about what you can do with MyChart, go to [email or web address].   Other Instructions   Your cardiac CT will be scheduled at one of the below locations:   [facility] [address] [place], [place] [address] [phone]  Please follow these instructions carefully (unless otherwise directed):  An IV will be required for this test and Nitroglycerin will be given.   On the Night Before the Test: Be sure to Drink plenty of water. Do not consume any caffeinated/decaffeinated beverages or chocolate 12 hours prior to your test. Do not take any antihistamines 12 hours prior to  your test.  On the Day of the Test: Drink plenty of water until 1 hour prior to the test. Do not eat any food 1 hour prior to test. You may take your regular medications prior to the test.  Take metoprolol  (Lopressor ) 25 mg two hours prior to test. FEMALES- please wear underwire-free bra if available, avoid dresses & tight clothing       After the Test: Drink plenty of water. After receiving IV contrast, you may experience a mild flushed feeling. This is normal. On occasion, you may experience a mild rash up to 24 hours after the test. This is not dangerous. If this occurs, you can take Benadryl 25 mg and increase your fluid intake. If you experience trouble breathing, this can be serious. If it is severe call 911 IMMEDIATELY. If it is mild, please call our office.  We will call to schedule your test 2-4 weeks out understanding that some insurance companies will need an authorization prior to the service being performed.   For more information and frequently asked questions, please visit our website : [email or web address]  For non-scheduling related questions, please contact the cardiac imaging nurse navigator should you have any questions/concerns: Cardiac Imaging Nurse Navigators Direct Office Dial: [phone]   For scheduling needs, including cancellations and rescheduling, please call Brittany, [phone].

## 2024-10-13 DIAGNOSIS — E041 Nontoxic single thyroid nodule: Secondary | ICD-10-CM | POA: Diagnosis not present

## 2024-10-14 DIAGNOSIS — R55 Syncope and collapse: Secondary | ICD-10-CM | POA: Diagnosis not present

## 2024-10-15 ENCOUNTER — Ambulatory Visit (INDEPENDENT_AMBULATORY_CARE_PROVIDER_SITE_OTHER): Admitting: Audiology

## 2024-10-15 DIAGNOSIS — H903 Sensorineural hearing loss, bilateral: Secondary | ICD-10-CM

## 2024-10-15 NOTE — Progress Notes (Signed)
  [address], [address] [place], [place] [address] [phone]  Audiological Evaluation    Name: Kristen Figueroa     DOB:   05/15/1946      MRN:   [id number]                                                                                     Service Date: 10/15/2024     Accompanied by: self   Patient comes today after Dr. Anice, ENT sent a referral for a hearing evaluation due to concerns with hearing loss.   Symptoms Yes Details  Hearing loss  [x]  Difficulty hearing when in a group of people, the right seems worse  Tinnitus  []    Ear pain/ infections/pressure  [x]  Periodically pressure in the right ear   Balance problems  [x]  Not bad, when standing up sometimes, reports has slow heart rate  Noise exposure history  []    Previous ear surgeries  []    Family history of hearing loss  []    Amplification  []    Other  [x]  Has sinus issues    Otoscopy: Right ear: Clear external ear canal and notable landmarks visualized on the tympanic membrane. Left ear:  Clear external ear canal and notable landmarks visualized on the tympanic membrane.  Tympanometry: Right ear: Normal external ear canal volume with normal middle ear pressure and tympanic membrane compliance (Type A). Findings are suggestive of normal middle ear function. Left ear: Normal external ear canal volume with normal middle ear pressure and tympanic membrane compliance (Type A). Findings are suggestive of normal middle ear function.   Hearing Evaluation The hearing test results were completed under headphones and results are deemed to be of good reliability. Test technique:  conventional    Pure tone Audiometry: Right ear- normal hearing sloping to moderate sensorineural hearing loss from 250 Hz - 8000 Hz. Left ear-  normal hearing sloping to moderately-severe sensorineural hearing loss from 250 Hz - 8000 Hz.  Speech Audiometry: Right ear- Speech Reception Threshold (SRT) was obtained at 35 dBHL. Left ear-Speech  Reception Threshold (SRT) was obtained at 30 dBHL.   Word Recognition Score Tested using NU-6 (recorded) Right ear: 100% was obtained at a presentation level of 70 dBHL with contralateral masking which is deemed as  excellent. Left ear: 100% was obtained at a presentation level of 70 dBHL with contralateral masking which is deemed as  excellent.   Impression: There is not a significant difference in pure-tone thresholds between ears. There is not a significant difference in the word recognition score in between ears.    Recommendations: Follow up with ENT as scheduled for today. Return for a hearing evaluation if concerns with hearing changes arise or per MD recommendation. Consider a communication needs assessment after medical clearance for hearing aids is obtained.   Sheriann Newmann MARIE LEROUX-MARTINEZ, AUD

## 2024-10-22 DIAGNOSIS — I10 Essential (primary) hypertension: Secondary | ICD-10-CM | POA: Diagnosis not present

## 2024-10-22 LAB — BASIC METABOLIC PANEL WITH GFR
BUN/Creatinine Ratio: 14 (ref 12–28)
BUN: 17 mg/dL (ref 8–27)
CO2: 25 mmol/L (ref 20–29)
Calcium: 8.9 mg/dL (ref 8.7–10.3)
Chloride: 103 mmol/L (ref 96–106)
Creatinine, Ser: 1.22 mg/dL — ABNORMAL HIGH (ref 0.57–1.00)
Glucose: 99 mg/dL (ref 70–99)
Potassium: 4.6 mmol/L (ref 3.5–5.2)
Sodium: 141 mmol/L (ref 134–144)
eGFR: 46 mL/min/1.73 — ABNORMAL LOW (ref >59)

## 2024-10-23 ENCOUNTER — Ambulatory Visit: Admitting: Internal Medicine

## 2024-10-23 ENCOUNTER — Ambulatory Visit: Payer: Self-pay | Admitting: Internal Medicine

## 2024-10-27 ENCOUNTER — Telehealth (INDEPENDENT_AMBULATORY_CARE_PROVIDER_SITE_OTHER): Payer: Self-pay

## 2024-10-27 NOTE — Telephone Encounter (Signed)
 Called patient to discuss getting them scheduled for a tonsil biopsy and assisting in getting them scheduled for their CT scan. Patient did not answer I left a voicemail for them to give us  a call back.   Patient called and let me know she will schedule her Ct scan today.  Patient also stated she was going to call on Monday to schedule her biopsy.  I am going to do a follow up on Monday with patient.

## 2024-10-28 ENCOUNTER — Ambulatory Visit

## 2024-10-28 DIAGNOSIS — R55 Syncope and collapse: Secondary | ICD-10-CM

## 2024-10-30 ENCOUNTER — Telehealth (HOSPITAL_COMMUNITY): Payer: Self-pay | Admitting: *Deleted

## 2024-10-30 NOTE — Telephone Encounter (Signed)
 Reaching out to patient to offer assistance regarding upcoming cardiac imaging study; pt verbalizes understanding of appt date/time, parking situation and where to check in, pre-test NPO status, and verified current allergies; name and call back number provided for further questions should they arise  Larey Brick RN Navigator Cardiac Imaging [facility] [phone] office [phone] cell

## 2024-10-31 ENCOUNTER — Ambulatory Visit (HOSPITAL_COMMUNITY)
Admission: RE | Admit: 2024-10-31 | Discharge: 2024-10-31 | Disposition: A | Discharge status: Home / Self Care | Source: Ambulatory Visit | Attending: Cardiovascular Disease | Admitting: Cardiovascular Disease

## 2024-10-31 DIAGNOSIS — R072 Precordial pain: Secondary | ICD-10-CM | POA: Insufficient documentation

## 2024-10-31 MED ORDER — IOHEXOL 350 MG/ML SOLN
100.0000 mL | Freq: Once | INTRAVENOUS | Status: AC | PRN
  Administered 2024-10-31: 100 mL via INTRAVENOUS

## 2024-10-31 MED ORDER — NITROGLYCERIN 0.4 MG SL SUBL
0.8000 mg | SUBLINGUAL_TABLET | Freq: Once | SUBLINGUAL | Status: AC
  Administered 2024-10-31: 0.8 mg via SUBLINGUAL

## 2024-11-02 ENCOUNTER — Other Ambulatory Visit: Payer: Self-pay | Admitting: Cardiovascular Disease

## 2024-11-02 ENCOUNTER — Ambulatory Visit (HOSPITAL_COMMUNITY)
Admission: RE | Admit: 2024-11-02 | Discharge: 2024-11-02 | Disposition: A | Discharge status: Home / Self Care | Source: Ambulatory Visit | Attending: Cardiovascular Disease | Admitting: Cardiovascular Disease

## 2024-11-02 DIAGNOSIS — R931 Abnormal findings on diagnostic imaging of heart and coronary circulation: Secondary | ICD-10-CM | POA: Insufficient documentation

## 2024-11-02 NOTE — Progress Notes (Signed)
 CT FFR ordered.  Signed, Darryle DASEN. Barbaraann, MD, [facility]  [facility]  [address] [place], [place] [address] [phone]  10:53 PM

## 2024-11-03 ENCOUNTER — Ambulatory Visit: Payer: Self-pay | Admitting: Internal Medicine

## 2024-11-03 NOTE — Telephone Encounter (Signed)
 PT returning call to nurse

## 2024-11-04 MED ORDER — METOPROLOL SUCCINATE ER 25 MG PO TB24: 12.5000 mg | ORAL_TABLET | Freq: Every evening | ORAL | 3 refills | Status: DC

## 2024-11-04 MED ORDER — ISOSORBIDE MONONITRATE ER 30 MG PO TB24: 15.0000 mg | ORAL_TABLET | Freq: Every evening | ORAL | 3 refills | Status: DC

## 2024-11-04 NOTE — Telephone Encounter (Signed)
 Patient returned RN's call regarding results.

## 2024-11-07 ENCOUNTER — Encounter: Payer: Self-pay | Admitting: Internal Medicine

## 2024-11-10 ENCOUNTER — Encounter: Payer: Self-pay | Admitting: Internal Medicine

## 2024-11-17 NOTE — Progress Notes (Signed)
 "  Cardiology Office Note:   Date:  11/25/2024  ID:  Kristen Figueroa, DOB 01/02/1946, MRN [id number] PCP:  Kristen Figueroa LABOR, MD  [facility] Providers Cardiologist:  Wendel Haws, MD Referring MD: Kristen Figueroa LABOR, MD  Chief Complaint/Reason for Referral: Follow-up coronary artery calcification, diastolic dysfunction ASSESSMENT:    1. Coronary artery disease of native artery of native heart with stable angina pectoris   2. Diastolic dysfunction   3. Syncope, unspecified syncope type   4. Primary hypertension   5. Hyperlipidemia LDL goal <70   6. Aortic atherosclerosis   7. Stage 3a chronic kidney disease (HCC)      PLAN:   In order of problems listed above: Coronary artery disease: Continue Toprol  12.5 mg and Imdur  15 mg.  The patient has stable angina.  This occurs maybe maybe once or twice a week.  It has not been accelerating.  The long conversation with the patient about potential treatment options including coronary angiography and PCI for relief of symptoms as she has on multiple antianginal agents and continues to have some degree of anginal discomfort and decrease her risk of nonfatal myocardial infarction.  She currently opts for medical therapy for now.  We will prescribe as needed nitroglycerin .  I told her if she develops any worsening symptoms that she should contact our office.  If she has chest pain at rest she should proceed to the emergency department. Diastolic dysfunction: Continue Toprol  12.5 mg, telmisartan  with dosage adjustment as below; consider Jardiance in future Syncope: Echocardiogram and monitor were reassuring. Hypertension: Blood pressure somewhat low today.  Decrease telmisartan  to 40 mg mg and Toprol  12.5 mg.     Hyperlipidemia: Continue atorvastatin  20 mg, check lipid panel, LFTs, LP(a) today CKD stage IIIa: Continue telmisartan  with dose adjustment as above.  Consider Jardiance in future           Dispo:  Return in about 3 months (around  02/23/2025).       I spent 37 minutes reviewing all clinical data during and prior to this visit including all relevant imaging studies, laboratories, clinical information from other health systems and prior notes from both Cardiology and other specialties, interviewing the patient, conducting a complete physical examination, and coordinating care in order to formulate a comprehensive and personalized evaluation and treatment plan.   History of Present Illness:    FOCUSED PROBLEM LIST:   Coronary artery disease FFR significant mid LAD lesion on coronary CTA December 2025 Diastolic dysfunction G1 DD, no significant valve issues, EF 60 to 65% TTE 2023 Hypertension Hyperlipidemia Aortic atherosclerosis CT abdomen pelvis 2017 CKD stage IIIa Syncope No AF, VT/VF, bradycardia, or ventricular or atrial ectopy monitor December 2025 BMI 06 October 2024:  Patient consents to use of AI scribe. The patient is 79 year old female with above listed medical problems referred to establish cardiovascular care.  She was last seen by the [facility] cardiology group in 2024.  She was doing well and no changes were made to her medical regimen.  She experiences chest pain described as pressure-like, primarily occurring after eating or during intense physical activity. She has a history of acid reflux, complicating the differentiation between cardiac and gastrointestinal pain. The pain resolves spontaneously.  She has had two episodes of syncope, one while reaching up to adjust a thermostat and another after a shower while applying oil. During these episodes, she experienced severe spasms starting from her head and radiating upwards, which she associates with stress. She has been  evaluated by a headache specialist but remains uncertain about the cause of these episodes.  No bad bleeding, bruising, or noticeable swelling. No significant issues with breathing.  Plan: Obtain coronary CTA, echocardiogram, monitor,  start aspirin  81 mg, atorvastatin  20 mg, check lipid panel, LFTs, LP(a) in 2 months.  Increase telmisartan  to 80 mg  January 2026:  Patient consents to use of AI scribe. In the interim the patient's coronary CTA suggested a mid LAD lesion.  She was started on Imdur  15 mg at bedtime and Toprol  12.5 mg at bedtime.  Her echocardiogram demonstrated no significant valvular issues with a preserved ejection fraction and grade 1 diastolic dysfunction.  She is here to discuss further.  She experiences chest discomfort primarily during exertion, such as climbing hills while showing property, occurring approximately two to three times per week. She describes the sensation more as shortness of breath rather than pain, stating 'I don't really have pain, more of shortness of breath.' No chest discomfort at rest.  She has been on Imdur  and Toprol  for three weeks, taking half a pill every month. She is uncertain about the dosage of her blood pressure medication, which was increased from 40 mg to 80 mg due to elevated blood pressure readings. Her blood pressure was noted to be 132/80 previously, and she is currently on telmisartan .  She mentions a long-standing experience of chest heaviness, which she attributes to her low heart rate, stating 'what I'm experiencing is something that I've been dealing with a long time.' This sensation has been present for the last ten to fifteen years.     Current Medications: Current Meds  Medication Sig   acetaminophen  (TYLENOL ) 500 MG tablet Take 500 mg by mouth every 6 (six) hours as needed for mild pain.   aspirin  EC 81 MG tablet Take 1 tablet (81 mg total) by mouth daily. Swallow whole.   atorvastatin  (LIPITOR) 20 MG tablet Take 1 tablet (20 mg total) by mouth daily.   Cholecalciferol  (VITAMIN D3) 125 MCG (5000 UT) CAPS Take 5,000 Units by mouth daily.   escitalopram  (LEXAPRO ) 10 MG tablet TAKE 1 TABLET(10 MG) BY MOUTH DAILY   fluticasone (FLONASE ALLERGY RELIEF) 50 MCG/ACT  nasal spray 1 spray in each nostril Nasally Once a day for 30 day(s)   isosorbide  mononitrate (IMDUR ) 30 MG 24 hr tablet Take 0.5 tablets (15 mg total) by mouth at bedtime.   levothyroxine  (SYNTHROID , LEVOTHROID) 50 MCG tablet Take 50 mcg by mouth daily.   metoprolol  succinate (TOPROL  XL) 25 MG 24 hr tablet Take 0.5 tablets (12.5 mg total) by mouth at bedtime.   Multiple Vitamin (MULTIVITAMIN) tablet Take 1 tablet by mouth daily.   nitroGLYCERIN  (NITROSTAT ) 0.4 MG SL tablet Place 1 tablet (0.4 mg total) under the tongue every 5 (five) minutes as needed for chest pain (Take only a maximum of 3 tablets per chest pain episode.).   pantoprazole  (PROTONIX ) 40 MG tablet Take 40 mg by mouth daily.   vitamin B-12 (CYANOCOBALAMIN ) 100 MCG tablet Take 100 mcg by mouth as needed.   [DISCONTINUED] telmisartan  (MICARDIS ) 80 MG tablet Take 1 tablet (80 mg total) by mouth daily.     Review of Systems:   Please see the history of present illness.    All other systems reviewed and are negative.     EKGs/Labs/Other Test Reviewed:   EKG: November 2025 sinus bradycardia  EKG Interpretation Date/Time:    Ventricular Rate:    PR Interval:    QRS Duration:  QT Interval:    QTC Calculation:   R Axis:      Text Interpretation:          CARDIAC STUDIES: Refer to CV Procedures and Imaging Tabs   Risk Assessment/Calculations:          Physical Exam:   VS:  BP (!) 100/42   Pulse (!) 58   Ht 5' 7 (1.702 m)   Wt 157 lb 3.2 oz (71.3 kg)   SpO2 97%   BMI 24.62 kg/m        Wt Readings from Last 3 Encounters:  11/25/24 157 lb 3.2 oz (71.3 kg)  10/07/24 155 lb (70.3 kg)  09/02/24 148 lb 8 oz (67.4 kg)      GENERAL:  No apparent distress, AOx3 HEENT:  No carotid bruits, +2 carotid impulses, no scleral icterus CAR: RRR no murmurs, gallops, rubs, or thrills RES:  Clear to auscultation bilaterally ABD:  Soft, nontender, nondistended, positive bowel sounds x 4 VASC:  +2 radial pulses, +2  carotid pulses NEURO:  CN 2-12 grossly intact; motor and sensory grossly intact PSYCH:  No active depression or anxiety EXT:  No edema, ecchymosis, or cyanosis  Signed, Marnee Sherrard K Eura Radabaugh, MD  11/25/2024 2:52 PM    [facility] [address] [place], [place], [place]  [address] Phone: [phone]; Fax: [phone]   Note:  This document was prepared using Dragon voice recognition software and may include unintentional dictation errors. "

## 2024-11-17 NOTE — H&P (View-Only) (Signed)
 "  Cardiology Office Note:   Date:  11/25/2024  ID:  Kristen Figueroa, DOB 01/02/1946, MRN [id number] PCP:  [name] LABOR, MD  [facility] Providers Cardiologist:  Wendel Haws, MD Referring MD: [name] LABOR, MD  Chief Complaint/Reason for Referral: Follow-up coronary artery calcification, diastolic dysfunction ASSESSMENT:    1. Coronary artery disease of native artery of native heart with stable angina pectoris   2. Diastolic dysfunction   3. Syncope, unspecified syncope type   4. Primary hypertension   5. Hyperlipidemia LDL goal <70   6. Aortic atherosclerosis   7. Stage 3a chronic kidney disease (HCC)      PLAN:   In order of problems listed above: Coronary artery disease: Continue Toprol  12.5 mg and Imdur  15 mg.  The patient has stable angina.  This occurs maybe maybe once or twice a week.  It has not been accelerating.  The long conversation with the patient about potential treatment options including coronary angiography and PCI for relief of symptoms as she has on multiple antianginal agents and continues to have some degree of anginal discomfort and decrease her risk of nonfatal myocardial infarction.  She currently opts for medical therapy for now.  We will prescribe as needed nitroglycerin .  I told her if she develops any worsening symptoms that she should contact our office.  If she has chest pain at rest she should proceed to the emergency department. Diastolic dysfunction: Continue Toprol  12.5 mg, telmisartan  with dosage adjustment as below; consider Jardiance in future Syncope: Echocardiogram and monitor were reassuring. Hypertension: Blood pressure somewhat low today.  Decrease telmisartan  to 40 mg mg and Toprol  12.5 mg.     Hyperlipidemia: Continue atorvastatin  20 mg, check lipid panel, LFTs, LP(a) today CKD stage IIIa: Continue telmisartan  with dose adjustment as above.  Consider Jardiance in future           Dispo:  Return in about 3 months (around  02/23/2025).       I spent 37 minutes reviewing all clinical data during and prior to this visit including all relevant imaging studies, laboratories, clinical information from other health systems and prior notes from both Cardiology and other specialties, interviewing the patient, conducting a complete physical examination, and coordinating care in order to formulate a comprehensive and personalized evaluation and treatment plan.   History of Present Illness:    FOCUSED PROBLEM LIST:   Coronary artery disease FFR significant mid LAD lesion on coronary CTA December 2025 Diastolic dysfunction G1 DD, no significant valve issues, EF 60 to 65% TTE 2023 Hypertension Hyperlipidemia Aortic atherosclerosis CT abdomen pelvis 2017 CKD stage IIIa Syncope No AF, VT/VF, bradycardia, or ventricular or atrial ectopy monitor December 2025 BMI 06 October 2024:  Patient consents to use of AI scribe. The patient is 79 year old female with above listed medical problems referred to establish cardiovascular care.  She was last seen by the [facility] cardiology group in 2024.  She was doing well and no changes were made to her medical regimen.  She experiences chest pain described as pressure-like, primarily occurring after eating or during intense physical activity. She has a history of acid reflux, complicating the differentiation between cardiac and gastrointestinal pain. The pain resolves spontaneously.  She has had two episodes of syncope, one while reaching up to adjust a thermostat and another after a shower while applying oil. During these episodes, she experienced severe spasms starting from her head and radiating upwards, which she associates with stress. She has been  evaluated by a headache specialist but remains uncertain about the cause of these episodes.  No bad bleeding, bruising, or noticeable swelling. No significant issues with breathing.  Plan: Obtain coronary CTA, echocardiogram, monitor,  start aspirin  81 mg, atorvastatin  20 mg, check lipid panel, LFTs, LP(a) in 2 months.  Increase telmisartan  to 80 mg  January 2026:  Patient consents to use of AI scribe. In the interim the patient's coronary CTA suggested a mid LAD lesion.  She was started on Imdur  15 mg at bedtime and Toprol  12.5 mg at bedtime.  Her echocardiogram demonstrated no significant valvular issues with a preserved ejection fraction and grade 1 diastolic dysfunction.  She is here to discuss further.  She experiences chest discomfort primarily during exertion, such as climbing hills while showing property, occurring approximately two to three times per week. She describes the sensation more as shortness of breath rather than pain, stating 'I don't really have pain, more of shortness of breath.' No chest discomfort at rest.  She has been on Imdur  and Toprol  for three weeks, taking half a pill every month. She is uncertain about the dosage of her blood pressure medication, which was increased from 40 mg to 80 mg due to elevated blood pressure readings. Her blood pressure was noted to be 132/80 previously, and she is currently on telmisartan .  She mentions a long-standing experience of chest heaviness, which she attributes to her low heart rate, stating 'what I'm experiencing is something that I've been dealing with a long time.' This sensation has been present for the last ten to fifteen years.     Current Medications: Current Meds  Medication Sig   acetaminophen  (TYLENOL ) 500 MG tablet Take 500 mg by mouth every 6 (six) hours as needed for mild pain.   aspirin  EC 81 MG tablet Take 1 tablet (81 mg total) by mouth daily. Swallow whole.   atorvastatin  (LIPITOR) 20 MG tablet Take 1 tablet (20 mg total) by mouth daily.   Cholecalciferol  (VITAMIN D3) 125 MCG (5000 UT) CAPS Take 5,000 Units by mouth daily.   escitalopram  (LEXAPRO ) 10 MG tablet TAKE 1 TABLET(10 MG) BY MOUTH DAILY   fluticasone (FLONASE ALLERGY RELIEF) 50 MCG/ACT  nasal spray 1 spray in each nostril Nasally Once a day for 30 day(s)   isosorbide  mononitrate (IMDUR ) 30 MG 24 hr tablet Take 0.5 tablets (15 mg total) by mouth at bedtime.   levothyroxine  (SYNTHROID , LEVOTHROID) 50 MCG tablet Take 50 mcg by mouth daily.   metoprolol  succinate (TOPROL  XL) 25 MG 24 hr tablet Take 0.5 tablets (12.5 mg total) by mouth at bedtime.   Multiple Vitamin (MULTIVITAMIN) tablet Take 1 tablet by mouth daily.   nitroGLYCERIN  (NITROSTAT ) 0.4 MG SL tablet Place 1 tablet (0.4 mg total) under the tongue every 5 (five) minutes as needed for chest pain (Take only a maximum of 3 tablets per chest pain episode.).   pantoprazole  (PROTONIX ) 40 MG tablet Take 40 mg by mouth daily.   vitamin B-12 (CYANOCOBALAMIN ) 100 MCG tablet Take 100 mcg by mouth as needed.   [DISCONTINUED] telmisartan  (MICARDIS ) 80 MG tablet Take 1 tablet (80 mg total) by mouth daily.     Review of Systems:   Please see the history of present illness.    All other systems reviewed and are negative.     EKGs/Labs/Other Test Reviewed:   EKG: November 2025 sinus bradycardia  EKG Interpretation Date/Time:    Ventricular Rate:    PR Interval:    QRS Duration:  QT Interval:    QTC Calculation:   R Axis:      Text Interpretation:          CARDIAC STUDIES: Refer to CV Procedures and Imaging Tabs   Risk Assessment/Calculations:          Physical Exam:   VS:  BP (!) 100/42   Pulse (!) 58   Ht 5' 7 (1.702 m)   Wt 157 lb 3.2 oz (71.3 kg)   SpO2 97%   BMI 24.62 kg/m        Wt Readings from Last 3 Encounters:  11/25/24 157 lb 3.2 oz (71.3 kg)  10/07/24 155 lb (70.3 kg)  09/02/24 148 lb 8 oz (67.4 kg)      GENERAL:  No apparent distress, AOx3 HEENT:  No carotid bruits, +2 carotid impulses, no scleral icterus CAR: RRR no murmurs, gallops, rubs, or thrills RES:  Clear to auscultation bilaterally ABD:  Soft, nontender, nondistended, positive bowel sounds x 4 VASC:  +2 radial pulses, +2  carotid pulses NEURO:  CN 2-12 grossly intact; motor and sensory grossly intact PSYCH:  No active depression or anxiety EXT:  No edema, ecchymosis, or cyanosis  Signed, Marnee Sherrard K Eura Radabaugh, MD  11/25/2024 2:52 PM    [facility] [address] [place], [place], [place]  [address] Phone: [phone]; Fax: [phone]   Note:  This document was prepared using Dragon voice recognition software and may include unintentional dictation errors. "

## 2024-11-25 ENCOUNTER — Ambulatory Visit: Attending: Internal Medicine | Admitting: Internal Medicine

## 2024-11-25 ENCOUNTER — Encounter: Payer: Self-pay | Admitting: Internal Medicine

## 2024-11-25 VITALS — BP 100/42 | HR 58 | Ht 67.0 in | Wt 157.2 lb

## 2024-11-25 DIAGNOSIS — I25118 Atherosclerotic heart disease of native coronary artery with other forms of angina pectoris: Secondary | ICD-10-CM | POA: Diagnosis not present

## 2024-11-25 DIAGNOSIS — E785 Hyperlipidemia, unspecified: Secondary | ICD-10-CM | POA: Diagnosis not present

## 2024-11-25 DIAGNOSIS — N1831 Chronic kidney disease, stage 3a: Secondary | ICD-10-CM

## 2024-11-25 DIAGNOSIS — I5189 Other ill-defined heart diseases: Secondary | ICD-10-CM

## 2024-11-25 DIAGNOSIS — R55 Syncope and collapse: Secondary | ICD-10-CM | POA: Diagnosis not present

## 2024-11-25 DIAGNOSIS — I700 Atherosclerosis of aorta: Secondary | ICD-10-CM | POA: Diagnosis not present

## 2024-11-25 DIAGNOSIS — I10 Essential (primary) hypertension: Secondary | ICD-10-CM

## 2024-11-25 MED ORDER — TELMISARTAN 40 MG PO TABS: 40.0000 mg | ORAL_TABLET | Freq: Every day | ORAL | 3 refills | Status: AC

## 2024-11-25 MED ORDER — NITROGLYCERIN 0.4 MG SL SUBL: 0.4000 mg | SUBLINGUAL_TABLET | SUBLINGUAL | 1 refills | Status: AC | PRN

## 2024-11-25 NOTE — Patient Instructions (Signed)
 Medication Instructions:  DECREASE Telmisartan  (Micardis ) to 40 mg once daily  AS NEEDED Nitroglycerin  0.4 mg SL for chest pain The proper use and anticipated side effects of nitroglycerine has been carefully explained.  If a single episode of chest pain is not relieved by one tablet, the patient will try another within 5 minutes; and if this doesn't relieve the pain, the patient is instructed to call 911 for transportation to an emergency department.   *If you need a refill on your cardiac medications before your next appointment, please call your pharmacy*  Lab Work: To be completed today: lipid panel, LFT, and lipoprotein-A  If you have labs (blood work) drawn today and your tests are completely normal, you will receive your results only by: MyChart Message (if you have MyChart) OR A paper copy in the mail If you have any lab test that is abnormal or we need to change your treatment, we will call you to review the results.  Testing/Procedures: None ordered today.  Follow-Up: At [facility], you and your health needs are our priority.  As part of our continuing mission to provide you with exceptional heart care, our providers are all part of one team.  This team includes your primary Cardiologist (physician) and Advanced Practice Providers or APPs (Physician Assistants and Nurse Practitioners) who all work together to provide you with the care you need, when you need it.  Your next appointment:   3 month(s)  Provider:   Arun K Thukkani, MD

## 2024-11-26 ENCOUNTER — Ambulatory Visit: Payer: Self-pay | Admitting: Internal Medicine

## 2024-11-26 LAB — HEPATIC FUNCTION PANEL
ALT: 10 IU/L (ref 0–32)
AST: 17 IU/L (ref 0–40)
Albumin: 4.2 g/dL (ref 3.8–4.8)
Alkaline Phosphatase: 74 IU/L (ref 49–135)
Bilirubin Total: 0.3 mg/dL (ref 0.0–1.2)
Bilirubin, Direct: 0.13 mg/dL (ref 0.00–0.40)
Total Protein: 6.1 g/dL (ref 6.0–8.5)

## 2024-11-26 LAB — LIPID PANEL
Chol/HDL Ratio: 2.2 ratio (ref 0.0–4.4)
Cholesterol, Total: 158 mg/dL (ref 100–199)
HDL: 73 mg/dL
LDL Chol Calc (NIH): 66 mg/dL (ref 0–99)
Triglycerides: 109 mg/dL (ref 0–149)
VLDL Cholesterol Cal: 19 mg/dL (ref 5–40)

## 2024-11-26 LAB — LIPOPROTEIN A (LPA): Lipoprotein (a): <8.4 nmol/L

## 2024-12-02 ENCOUNTER — Ambulatory Visit (HOSPITAL_COMMUNITY)
Admission: RE | Admit: 2024-12-02 | Discharge: 2024-12-02 | Disposition: A | Discharge status: Home / Self Care | Source: Ambulatory Visit | Attending: Cardiology | Admitting: Cardiology

## 2024-12-02 DIAGNOSIS — R072 Precordial pain: Secondary | ICD-10-CM | POA: Diagnosis not present

## 2024-12-02 LAB — ECHOCARDIOGRAM COMPLETE
Area-P 1/2: 2.84 cm2
S' Lateral: 2.5 cm

## 2024-12-03 NOTE — Telephone Encounter (Signed)
 Discussion on separate MyChart thread.

## 2024-12-04 ENCOUNTER — Telehealth: Payer: Self-pay | Admitting: Internal Medicine

## 2024-12-04 ENCOUNTER — Other Ambulatory Visit: Payer: Self-pay

## 2024-12-04 DIAGNOSIS — Z01812 Encounter for preprocedural laboratory examination: Secondary | ICD-10-CM

## 2024-12-04 DIAGNOSIS — I25118 Atherosclerotic heart disease of native coronary artery with other forms of angina pectoris: Secondary | ICD-10-CM

## 2024-12-04 DIAGNOSIS — I10 Essential (primary) hypertension: Secondary | ICD-10-CM

## 2024-12-04 DIAGNOSIS — R072 Precordial pain: Secondary | ICD-10-CM

## 2024-12-04 NOTE — Telephone Encounter (Signed)
 Pt said she is returning nurse Peyton's call

## 2024-12-05 ENCOUNTER — Ambulatory Visit: Attending: Cardiovascular Disease

## 2024-12-05 ENCOUNTER — Other Ambulatory Visit (HOSPITAL_COMMUNITY): Payer: Self-pay

## 2024-12-05 VITALS — BP 171/76 | HR 58 | Ht 66.0 in | Wt 156.0 lb

## 2024-12-05 DIAGNOSIS — I25118 Atherosclerotic heart disease of native coronary artery with other forms of angina pectoris: Secondary | ICD-10-CM

## 2024-12-05 LAB — CBC

## 2024-12-05 MED ORDER — OMRON 3 SERIES BP MONITOR DEVI
1.0000 | Freq: Every day | 0 refills | Status: AC
  Filled 2024-12-05: qty 1, 30d supply, fill #0

## 2024-12-05 NOTE — Telephone Encounter (Signed)
"    Per Dr Wendel,  Drug eluting. Likely Syngery. But we need to take the pictures first and see  "

## 2024-12-05 NOTE — Telephone Encounter (Signed)
 Spoke with pt on the phone yesterday d/t her not reading the [facility] message. Pt said she can just come to the office today (1/23) to pick up cath instructions and complete EKG and lab work at the same time. Pt is scheduled for a nurse visit at 10 AM.

## 2024-12-05 NOTE — Addendum Note (Signed)
 Addended by: DARRELL BAREFOOT on: 12/05/2024 10:38 AM   Modules accepted: Orders

## 2024-12-06 ENCOUNTER — Ambulatory Visit: Payer: Self-pay | Admitting: Internal Medicine

## 2024-12-06 LAB — BASIC METABOLIC PANEL WITH GFR
BUN/Creatinine Ratio: 14 (ref 12–28)
BUN: 17 mg/dL (ref 8–27)
CO2: 24 mmol/L (ref 20–29)
Calcium: 9.1 mg/dL (ref 8.7–10.3)
Chloride: 104 mmol/L (ref 96–106)
Creatinine, Ser: 1.2 mg/dL — ABNORMAL HIGH (ref 0.57–1.00)
Glucose: 98 mg/dL (ref 70–99)
Potassium: 4.3 mmol/L (ref 3.5–5.2)
Sodium: 142 mmol/L (ref 134–144)
eGFR: 47 mL/min/{1.73_m2} — ABNORMAL LOW

## 2024-12-06 LAB — CBC
Hematocrit: 35.9 % (ref 34.0–46.6)
Hemoglobin: 12 g/dL (ref 11.1–15.9)
MCH: 31.4 pg (ref 26.6–33.0)
MCHC: 33.4 g/dL (ref 31.5–35.7)
MCV: 94 fL (ref 79–97)
Platelets: 175 10*3/uL (ref 150–450)
RBC: 3.82 x10E6/uL (ref 3.77–5.28)
RDW: 12 % (ref 11.7–15.4)
WBC: 5.7 10*3/uL (ref 3.4–10.8)

## 2024-12-09 ENCOUNTER — Encounter (HOSPITAL_COMMUNITY): Admission: RE | Disposition: A | Payer: Self-pay | Source: Home / Self Care | Attending: Internal Medicine

## 2024-12-09 ENCOUNTER — Ambulatory Visit (HOSPITAL_COMMUNITY)
Admission: RE | Admit: 2024-12-09 | Discharge: 2024-12-09 | Disposition: A | Discharge status: Home / Self Care | Attending: Internal Medicine | Admitting: Internal Medicine

## 2024-12-09 ENCOUNTER — Other Ambulatory Visit: Payer: Self-pay

## 2024-12-09 ENCOUNTER — Other Ambulatory Visit (HOSPITAL_COMMUNITY): Payer: Self-pay

## 2024-12-09 DIAGNOSIS — Z79899 Other long term (current) drug therapy: Secondary | ICD-10-CM | POA: Insufficient documentation

## 2024-12-09 DIAGNOSIS — I130 Hypertensive heart and chronic kidney disease with heart failure and stage 1 through stage 4 chronic kidney disease, or unspecified chronic kidney disease: Secondary | ICD-10-CM | POA: Insufficient documentation

## 2024-12-09 DIAGNOSIS — I700 Atherosclerosis of aorta: Secondary | ICD-10-CM | POA: Insufficient documentation

## 2024-12-09 DIAGNOSIS — E785 Hyperlipidemia, unspecified: Secondary | ICD-10-CM | POA: Diagnosis not present

## 2024-12-09 DIAGNOSIS — I5032 Chronic diastolic (congestive) heart failure: Secondary | ICD-10-CM | POA: Diagnosis not present

## 2024-12-09 DIAGNOSIS — R55 Syncope and collapse: Secondary | ICD-10-CM | POA: Insufficient documentation

## 2024-12-09 DIAGNOSIS — I25119 Atherosclerotic heart disease of native coronary artery with unspecified angina pectoris: Secondary | ICD-10-CM

## 2024-12-09 DIAGNOSIS — N1831 Chronic kidney disease, stage 3a: Secondary | ICD-10-CM | POA: Diagnosis not present

## 2024-12-09 DIAGNOSIS — I25118 Atherosclerotic heart disease of native coronary artery with other forms of angina pectoris: Secondary | ICD-10-CM | POA: Insufficient documentation

## 2024-12-09 DIAGNOSIS — I2510 Atherosclerotic heart disease of native coronary artery without angina pectoris: Secondary | ICD-10-CM | POA: Insufficient documentation

## 2024-12-09 DIAGNOSIS — I209 Angina pectoris, unspecified: Secondary | ICD-10-CM | POA: Diagnosis present

## 2024-12-09 DIAGNOSIS — Z955 Presence of coronary angioplasty implant and graft: Secondary | ICD-10-CM

## 2024-12-09 LAB — POCT ACTIVATED CLOTTING TIME
Activated Clotting Time: 250 s
Activated Clotting Time: 342 s

## 2024-12-09 MED ORDER — SODIUM CHLORIDE 0.9% FLUSH: 3.0000 mL | Freq: Two times a day (BID) | INTRAVENOUS | Status: DC

## 2024-12-09 MED ORDER — LABETALOL HCL 5 MG/ML IV SOLN: 10.0000 mg | INTRAVENOUS | Status: DC | PRN

## 2024-12-09 MED ORDER — NITROGLYCERIN 1 MG/10 ML FOR IR/CATH LAB
INTRA_ARTERIAL | Status: DC | PRN
  Administered 2024-12-09: 100 ug via INTRACORONARY

## 2024-12-09 MED ORDER — SODIUM CHLORIDE 0.9 % IV SOLN: 250.0000 mL | INTRAVENOUS | Status: DC | PRN

## 2024-12-09 MED ORDER — ASPIRIN 81 MG PO CHEW: 81.0000 mg | CHEWABLE_TABLET | ORAL | Status: DC

## 2024-12-09 MED ORDER — VERAPAMIL HCL 2.5 MG/ML IV SOLN
INTRAVENOUS | Status: AC
  Filled 2024-12-09: qty 2

## 2024-12-09 MED ORDER — MIDAZOLAM HCL 2 MG/2ML IJ SOLN
INTRAMUSCULAR | Status: AC
  Filled 2024-12-09: qty 2

## 2024-12-09 MED ORDER — CLOPIDOGREL BISULFATE 300 MG PO TABS
ORAL_TABLET | ORAL | Status: AC
  Filled 2024-12-09: qty 2

## 2024-12-09 MED ORDER — FENTANYL CITRATE (PF) 100 MCG/2ML IJ SOLN
INTRAMUSCULAR | Status: AC
  Filled 2024-12-09: qty 2

## 2024-12-09 MED ORDER — SODIUM CHLORIDE 0.9% FLUSH: 3.0000 mL | INTRAVENOUS | Status: DC | PRN

## 2024-12-09 MED ORDER — ONDANSETRON HCL 4 MG/2ML IJ SOLN: 4.0000 mg | Freq: Four times a day (QID) | INTRAMUSCULAR | Status: DC | PRN

## 2024-12-09 MED ORDER — HYDRALAZINE HCL 20 MG/ML IJ SOLN: 10.0000 mg | INTRAMUSCULAR | Status: DC | PRN

## 2024-12-09 MED ORDER — ASPIRIN 81 MG PO TBEC
81.0000 mg | DELAYED_RELEASE_TABLET | Freq: Every day | ORAL | 0 refills | Status: AC
  Filled 2024-12-09: qty 30, 30d supply, fill #0

## 2024-12-09 MED ORDER — FENTANYL CITRATE (PF) 100 MCG/2ML IJ SOLN
INTRAMUSCULAR | Status: DC | PRN
  Administered 2024-12-09 (×3): 25 ug via INTRAVENOUS

## 2024-12-09 MED ORDER — CLOPIDOGREL BISULFATE 300 MG PO TABS
ORAL_TABLET | ORAL | Status: DC | PRN
  Administered 2024-12-09: 600 mg via ORAL

## 2024-12-09 MED ORDER — CLOPIDOGREL BISULFATE 75 MG PO TABS: 75.0000 mg | ORAL_TABLET | Freq: Every day | ORAL | 10 refills | Status: DC

## 2024-12-09 MED ORDER — HEPARIN SODIUM (PORCINE) 1000 UNIT/ML IJ SOLN
INTRAMUSCULAR | Status: DC | PRN
  Administered 2024-12-09: 1000 [IU] via INTRAVENOUS
  Administered 2024-12-09: 5000 [IU] via INTRAVENOUS
  Administered 2024-12-09: 4000 [IU] via INTRAVENOUS

## 2024-12-09 MED ORDER — ACETAMINOPHEN 325 MG PO TABS: 650.0000 mg | ORAL_TABLET | ORAL | Status: DC | PRN

## 2024-12-09 MED ORDER — MIDAZOLAM HCL (PF) 2 MG/2ML IJ SOLN
INTRAMUSCULAR | Status: DC | PRN
  Administered 2024-12-09 (×3): 1 mg via INTRAVENOUS

## 2024-12-09 MED ORDER — HEPARIN SODIUM (PORCINE) 1000 UNIT/ML IJ SOLN
INTRAMUSCULAR | Status: AC
  Filled 2024-12-09: qty 10

## 2024-12-09 MED ORDER — FUROSEMIDE 10 MG/ML IJ SOLN
INTRAMUSCULAR | Status: AC
  Filled 2024-12-09: qty 4

## 2024-12-09 MED ORDER — FUROSEMIDE 10 MG/ML IJ SOLN
INTRAMUSCULAR | Status: DC | PRN
  Administered 2024-12-09: 10 mg via INTRAVENOUS

## 2024-12-09 MED ORDER — HEPARIN (PORCINE) IN NACL 1000-0.9 UT/500ML-% IV SOLN
INTRAVENOUS | Status: DC | PRN
  Administered 2024-12-09: 1000 mL

## 2024-12-09 MED ORDER — ASPIRIN 81 MG PO CHEW: 81.0000 mg | CHEWABLE_TABLET | Freq: Every day | ORAL | Status: DC

## 2024-12-09 MED ORDER — LIDOCAINE HCL (PF) 1 % IJ SOLN
INTRAMUSCULAR | Status: DC | PRN
  Administered 2024-12-09: 5 mL via INTRADERMAL

## 2024-12-09 MED ORDER — IOHEXOL 350 MG/ML SOLN
INTRAVENOUS | Status: DC | PRN
  Administered 2024-12-09: 125 mL

## 2024-12-09 MED ORDER — VERAPAMIL HCL 2.5 MG/ML IV SOLN
INTRAVENOUS | Status: DC | PRN
  Administered 2024-12-09: 10 mL via INTRA_ARTERIAL

## 2024-12-09 MED ORDER — CLOPIDOGREL BISULFATE 75 MG PO TABS: 75.0000 mg | ORAL_TABLET | Freq: Every day | ORAL | Status: DC

## 2024-12-09 MED ORDER — CLOPIDOGREL BISULFATE 75 MG PO TABS
75.0000 mg | ORAL_TABLET | Freq: Every day | ORAL | 10 refills | Status: AC
  Filled 2024-12-09: qty 90, 90d supply, fill #0

## 2024-12-09 MED ORDER — PANTOPRAZOLE SODIUM 40 MG PO TBEC
40.0000 mg | DELAYED_RELEASE_TABLET | Freq: Every day | ORAL | 1 refills | Status: AC
  Filled 2024-12-09: qty 30, 30d supply, fill #0

## 2024-12-09 MED ORDER — LIDOCAINE HCL (PF) 1 % IJ SOLN
INTRAMUSCULAR | Status: AC
  Filled 2024-12-09: qty 30

## 2024-12-09 MED ORDER — ASPIRIN 81 MG PO TBEC: 81.0000 mg | DELAYED_RELEASE_TABLET | Freq: Every day | ORAL | 0 refills | Status: DC

## 2024-12-09 MED ORDER — NITROGLYCERIN 1 MG/10 ML FOR IR/CATH LAB
INTRA_ARTERIAL | Status: AC
  Filled 2024-12-09: qty 10

## 2024-12-09 MED ORDER — FREE WATER: 500.0000 mL | Freq: Once | Status: DC

## 2024-12-09 NOTE — Interval H&P Note (Signed)
 History and Physical Interval Note:  12/09/2024 7:14 AM  Kristen Figueroa  has presented today for surgery, with the diagnosis of cad.  The various methods of treatment have been discussed with the patient and family. After consideration of risks, benefits and other options for treatment, the patient has consented to  Procedures: LEFT HEART CATH AND CORONARY ANGIOGRAPHY (N/A) as a surgical intervention.  The patient's history has been reviewed, patient examined, no change in status, stable for surgery.  I have reviewed the patient's chart and labs.  Questions were answered to the patient's satisfaction.     Kristen Figueroa

## 2024-12-09 NOTE — Discharge Instructions (Signed)

## 2024-12-09 NOTE — Progress Notes (Signed)
 Pt and husband received discharge instructions, teachback performed. Iv removed, no complications. Rt radial site is clean dry intact, site is soft, no signs of bleeding. Pt escorted out via wheelchair to husband's vehicle.

## 2024-12-09 NOTE — Discharge Summary (Addendum)
 "   Discharge Summary for Same Day PCI   Patient ID: Kristen Figueroa MRN: [id number]; DOB: 1945-12-14  Admit date: 12/09/2024 Discharge date: 12/09/2024  Primary Care Provider: [name] LABOR, MD  Primary Cardiologist: Lurena MARLA Red, MD  Primary Electrophysiologist:  Soyla Gladis Norton, MD   Discharge Diagnoses    Active Problems:   CAD (coronary artery disease)    Diagnostic Studies/Procedures    Cardiac Catheterization 12/09/2024:  Mid LAD lesion is 80% stenosed.   A stent was successfully placed.   Post intervention, there is a 0% residual stenosis.   1.  80% mid LAD lesion.  It was very eccentric.  FFR angiography demonstrated a level of 0.72.  Intervention was performed with a 2.75 x 20 mm Synergy XD stent that was postdilated to 3.0 mm.  FFR angiography after PCI was 0.94.  OCT analysis demonstrated no distal dissections. 2.  LVEDP of 32 mmHg; the patient was administered Lasix  10 mg IV x 1 3.  Chest pain after stent implantation likely representing arterial stretch.  EKG done following the procedure demonstrated no ischemic changes and OCT demonstrated no distal dissections.   Summary: Tentatively planning 6 hours bedrest and discharged with aspirin  and Plavix  for 6 months followed by Plavix  monotherapy.  Patient's chest pain likely due to arterial stretch.  If patient's chest pain has not improved we will admit for overnight observation.   _____________   History of Present Illness     Kristen Figueroa is a 79 y.o. female with CAD on CCTA 10/2024, hypertension, hyperlipidemia, CKD, syncope, chronic HFpEF.   Patient has had prior coronary CTA 10/2024 indicating mid LAD lesion.  PTA had stable anginal symptoms and persistent pain despite medical therapy.  Cardiac catheterization was arranged for further evaluation.  Hospital Course     The patient underwent cardiac cath as noted above with 80% stenosis in the mid LAD status post successful DES.  No other significant  disease noted.  Plan for DAPT with ASA/Plavix  for at least 6 months followed by Plavix  monotherapy. The patient was seen by cardiac rehab while in short stay. There were no observed complications post cath. Radial cath site was re-evaluated prior to discharge and found to be stable without any complications. Instructions/precautions regarding cath site care were given prior to discharge.  Kristen Figueroa was seen by Dr. Red and determined stable for discharge home. Follow up with our office has been arranged. Medications are listed below. Pertinent changes include   MD stopped Imdur  and metoprolol . Added Protonix  instead omeprazole given plavix .  Plan for DC around 1600 today assuming now further issues or complaints. She is feeling good and agreeable to DC.     _____________  Cath/PCI Registry Performance & Quality Measures: Aspirin  prescribed? - Yes ADP Receptor Inhibitor (Plavix /Clopidogrel , Brilinta/Ticagrelor or Effient/Prasugrel) prescribed (includes medically managed patients)? - Yes High Intensity Statin (Lipitor 40-80mg  or Crestor 20-40mg ) prescribed? - No - at target goal <70 per prior note For EF <40%, was ACEI/ARB prescribed? - Not Applicable (EF >/= 40%) For EF <40%, Aldosterone Antagonist (Spironolactone or Eplerenone) prescribed? - Not Applicable (EF >/= 40%) Cardiac Rehab Phase II ordered (Included Medically managed Patients)? - Yes  _____________   Discharge Vitals Blood pressure 108/79, pulse (!) 57, temperature 98 F (36.7 C), temperature source Oral, resp. rate 15, height 5' 7 (1.702 m), weight 66.7 kg, SpO2 97%.  Filed Weights   12/09/24 0750  Weight: 66.7 kg    Last Labs & Radiologic  Studies    CBC No results for input(s): WBC, NEUTROABS, HGB, HCT, MCV, PLT in the last 72 hours. Basic Metabolic Panel No results for input(s): NA, K, CL, CO2, GLUCOSE, BUN, CREATININE, CALCIUM , MG, PHOS in the last 72 hours. Liver Function  Tests No results for input(s): AST, ALT, ALKPHOS, BILITOT, PROT, ALBUMIN in the last 72 hours. No results for input(s): LIPASE, AMYLASE in the last 72 hours. High Sensitivity Troponin:   No results for input(s): TROPONINIHS in the last 720 hours.  BNP Invalid input(s): POCBNP D-Dimer No results for input(s): DDIMER in the last 72 hours. Hemoglobin A1C No results for input(s): HGBA1C in the last 72 hours. Fasting Lipid Panel No results for input(s): CHOL, HDL, LDLCALC, TRIG, CHOLHDL, LDLDIRECT in the last 72 hours. Thyroid  Function Tests No results for input(s): TSH, T4TOTAL, T3FREE, THYROIDAB in the last 72 hours.  Invalid input(s): FREET3 _____________  CARDIAC CATHETERIZATION Result Date: 12/09/2024   Mid LAD lesion is 80% stenosed.   A stent was successfully placed.   Post intervention, there is a 0% residual stenosis. 1.  80% mid LAD lesion.  It was very eccentric.  FFR angiography demonstrated a level of 0.72.  Intervention was performed with a 2.75 x 20 mm Synergy XD stent that was postdilated to 3.0 mm.  FFR angiography after PCI was 0.94.  OCT analysis demonstrated no distal dissections. 2.  LVEDP of 32 mmHg; the patient was administered Lasix  10 mg IV x 1 3.  Chest pain after stent implantation likely representing arterial stretch.  EKG done following the procedure demonstrated no ischemic changes and OCT demonstrated no distal dissections. Summary: Tentatively planning 6 hours bedrest and discharged with aspirin  and Plavix  for 6 months followed by Plavix  monotherapy.  Patient's chest pain likely due to arterial stretch.  If patient's chest pain has not improved we will admit for overnight observation.    ECHOCARDIOGRAM COMPLETE Result Date: 12/02/2024    ECHOCARDIOGRAM REPORT   Patient Name:   Kristen Figueroa  Date of Exam: 12/02/2024 Medical Rec #:  [id number]     Height:       66.9 in Accession #:    [id number]    Weight:       157.2 lb Date  of Birth:  [birth year]      BSA:          1.824 m Patient Age:    78 years      BP:           132/80 mmHg Patient Gender: F             HR:           55 bpm. Exam Location:  [address] Procedure: 2D Echo, 3D Echo, Cardiac Doppler, Color Doppler and Strain Analysis            (Both Spectral and Color Flow Doppler were utilized during            procedure). Indications:    R07.9 Chest pain  History:        Patient has prior history of Echocardiogram examinations, most                 recent 09/19/2022. CAD, Signs/Symptoms:Chest Pain and Syncope;                 Risk Factors:Hypertension and Dyslipidemia.  Sonographer:    Elsie Bohr RDCS Referring Phys: [id number] Hideko Esselman K Unique Sillas IMPRESSIONS  1. Left ventricular ejection fraction, by estimation,  is 60 to 65%. Left ventricular ejection fraction by 3D volume is 62 %. The left ventricle has normal function. The left ventricle has no regional wall motion abnormalities. Left ventricular diastolic  parameters are consistent with Grade I diastolic dysfunction (impaired relaxation). The average left ventricular global longitudinal strain is -21.1 %. The global longitudinal strain is normal.  2. Right ventricular systolic function is normal. The right ventricular size is normal.  3. The mitral valve is normal in structure. Trivial mitral valve regurgitation. No evidence of mitral stenosis.  4. Tricuspid valve regurgitation is mild to moderate.  5. The aortic valve is normal in structure. Aortic valve regurgitation is not visualized. No aortic stenosis is present.  6. The inferior vena cava is normal in size with greater than 50% respiratory variability, suggesting right atrial pressure of 3 mmHg. FINDINGS  Left Ventricle: Left ventricular ejection fraction, by estimation, is 60 to 65%. Left ventricular ejection fraction by 3D volume is 62 %. The left ventricle has normal function. The left ventricle has no regional wall motion abnormalities. The average left ventricular  global longitudinal strain is -21.1 %. Strain was performed and the global longitudinal strain is normal. The left ventricular internal cavity size was normal in size. There is no left ventricular hypertrophy. Left ventricular diastolic parameters are consistent with Grade I diastolic dysfunction (impaired relaxation). Right Ventricle: The right ventricular size is normal. No increase in right ventricular wall thickness. Right ventricular systolic function is normal. Left Atrium: Left atrial size was normal in size. Right Atrium: Right atrial size was normal in size. Pericardium: There is no evidence of pericardial effusion. Mitral Valve: The mitral valve is normal in structure. Trivial mitral valve regurgitation. No evidence of mitral valve stenosis. Tricuspid Valve: The tricuspid valve is normal in structure. Tricuspid valve regurgitation is mild to moderate. No evidence of tricuspid stenosis. Aortic Valve: The aortic valve is normal in structure. Aortic valve regurgitation is not visualized. No aortic stenosis is present. Pulmonic Valve: The pulmonic valve was normal in structure. Pulmonic valve regurgitation is trivial. No evidence of pulmonic stenosis. Aorta: The aortic root is normal in size and structure. Venous: The inferior vena cava is normal in size with greater than 50% respiratory variability, suggesting right atrial pressure of 3 mmHg. IAS/Shunts: No atrial level shunt detected by color flow Doppler. Additional Comments: 3D was performed not requiring image post processing on an independent workstation and was normal.  LEFT VENTRICLE PLAX 2D LVIDd:         4.10 cm         Diastology LVIDs:         2.50 cm         LV e' medial:    6.20 cm/s LV PW:         1.10 cm         LV E/e' medial:  14.8 LV IVS:        1.10 cm         LV e' lateral:   8.05 cm/s LVOT diam:     1.90 cm         LV E/e' lateral: 11.4 LV SV:         74 LV SV Index:   40              2D Longitudinal LVOT Area:     2.84 cm        Strain  LV IVRT:       106 msec  2D Strain GLS   -20.6 %                                (A4C):                                2D Strain GLS   -20.8 %                                (A3C):                                2D Strain GLS   -22.0 %                                (A2C):                                2D Strain GLS   -21.1 %                                Avg:                                 3D Volume EF                                LV 3D EF:    Left                                             ventricul                                             ar                                             ejection                                             fraction                                             by 3D                                             volume is  62 %.                                 3D Volume EF:                                3D EF:        62 %                                LV EDV:       87 ml                                LV ESV:       33 ml                                LV SV:        54 ml RIGHT VENTRICLE             IVC RV S prime:     13.70 cm/s  IVC diam: 1.80 cm TAPSE (M-mode): 3.0 cm RVSP:           39.0 mmHg   PULMONARY VEINS                             Diastolic Velocity: 54.00 cm/s                             S/D Velocity:       1.20                             Systolic Velocity:  66.00 cm/s LEFT ATRIUM             Index        RIGHT ATRIUM           Index LA diam:        3.30 cm 1.81 cm/m   RA Pressure: 3.00 mmHg LA Vol (A2C):   36.8 ml 20.18 ml/m  RA Area:     10.90 cm LA Vol (A4C):   32.3 ml 17.71 ml/m  RA Volume:   25.70 ml  14.09 ml/m LA Biplane Vol: 34.3 ml 18.81 ml/m  AORTIC VALVE LVOT Vmax:   114.00 cm/s LVOT Vmean:  71.700 cm/s LVOT VTI:    0.260 m  AORTA Ao Root diam: 3.20 cm Ao Asc diam:  3.00 cm MITRAL VALVE               TRICUSPID VALVE MV Area (PHT): 2.84 cm    TR Peak grad:   36.0 mmHg MV Decel Time: 267 msec    TR  Vmax:        300.00 cm/s MV E velocity: 91.70 cm/s  Estimated RAP:  3.00 mmHg MV A velocity: 96.40 cm/s  RVSP:           39.0 mmHg MV E/A ratio:  0.95  SHUNTS                            Systemic VTI:  0.26 m                            Systemic Diam: 1.90 cm Morene Brownie Electronically signed by Morene Brownie Signature Date/Time: 12/02/2024/1:19:46 PM    Final     Disposition   Pt is being discharged home today in good condition.  Follow-up Plans & Appointments     Discharge Instructions     Amb Referral to Cardiac Rehabilitation   Complete by: As directed    Diagnosis: Coronary Stents   After initial evaluation and assessments completed: Virtual Based Care may be provided alone or in conjunction with Phase 2 Cardiac Rehab based on patient barriers.: Yes   Intensive Cardiac Rehabilitation (ICR) [facility] location only OR Traditional Cardiac Rehabilitation (TCR) *If criteria for ICR are not met will enroll in TCR [facility] only): Yes        Discharge Medications   Allergies as of 12/09/2024       Reactions   Codeine Other (See Comments)   REACTION: stomach cramps, swelling   Levaquin [levofloxacin] Nausea Only   Tetanus Antitoxin    Other reaction(s): Unknown        Medication List     STOP taking these medications    isosorbide  mononitrate 30 MG 24 hr tablet Commonly known as: IMDUR    metoprolol  succinate 25 MG 24 hr tablet Commonly known as: Toprol  XL   omeprazole 20 MG capsule Commonly known as: PRILOSEC       TAKE these medications    acetaminophen  500 MG tablet Commonly known as: TYLENOL  Take 500 mg by mouth every 6 (six) hours as needed for mild pain.   aspirin  EC 81 MG tablet Take 1 tablet (81 mg total) by mouth daily. Swallow whole.   atorvastatin  20 MG tablet Commonly known as: LIPITOR Take 1 tablet (20 mg total) by mouth daily.   clopidogrel  75 MG tablet Commonly known as: Plavix  Take 1 tablet (75 mg total) by mouth  daily.   cyanocobalamin  1000 MCG tablet Commonly known as: VITAMIN B12 Take 1,000 mcg by mouth daily.   escitalopram  10 MG tablet Commonly known as: LEXAPRO  TAKE 1 TABLET(10 MG) BY MOUTH DAILY   Flonase Allergy Relief 50 MCG/ACT nasal spray Generic drug: fluticasone Place 1 spray into both nostrils daily as needed for allergies.   levothyroxine  50 MCG tablet Commonly known as: SYNTHROID  Take 50 mcg by mouth daily before breakfast.   nitroGLYCERIN  0.4 MG SL tablet Commonly known as: NITROSTAT  Place 1 tablet (0.4 mg total) under the tongue every 5 (five) minutes as needed for chest pain (Take only a maximum of 3 tablets per chest pain episode.).   Omron 3 Series BP Monitor Devi Use to measure blood pressure as directed.   pantoprazole  40 MG tablet Commonly known as: Protonix  Take 1 tablet (40 mg total) by mouth daily.   telmisartan  40 MG tablet Commonly known as: MICARDIS  Take 1 tablet (40 mg total) by mouth daily.   Vitamin D3 125 MCG (5000 UT) Caps Take 5,000 Units by mouth daily.           Allergies Allergies[1]  Outstanding Labs/Studies    Duration of Discharge Encounter   Greater than 30 minutes including physician time.  Signed, Thom LITTIE Sluder, PA-C 12/09/2024,  2:16 PM    ATTENDING ATTESTATION:  After conducting a review of all available clinical information with the care team, interviewing the patient, and performing a physical exam, I agree with the findings and plan described in this note with adjustments as indicated below which were discussed and enacted by staff above.   GEN: No acute distress, AO x 3 HEENT:  MMM, no JVD, no scleral icterus Cardiac: RRR, no murmurs, rubs, or gallops.  Respiratory: Clear to auscultation bilaterally. GI: Soft, nontender, non-distended  MS: No edema; No deformity. Neuro:  Nonfocal  Vasc: Mild bruising right radial site  Patient is doing well after uncomplicated PCI of mid LAD lesion.  Her chest pain after the  procedure which was thought to be arterial stretching pain has completely resolved.  She is stable for discharge home.  We will stop the patient's Imdur  and metoprolol  now that her high-grade LAD lesion has been addressed.  She will see me back in 2 days for procedure follow-up.  APP discharge time: 14 MD discharge time:18  Lurena Red, MD Pager [phone]     [1]  Allergies Allergen Reactions   Codeine Other (See Comments)    REACTION: stomach cramps, swelling   Levaquin [Levofloxacin] Nausea Only   Tetanus Antitoxin     Other reaction(s): Unknown   "

## 2024-12-09 NOTE — Progress Notes (Unsigned)
 "  Cardiology Office Note:   Date:  12/11/2024  ID:  Kristen Figueroa, DOB [birth year], MRN [id number] PCP:  [name] LABOR, MD  [facility] Providers Cardiologist:  Wendel Haws, MD Referring MD: [name] LABOR, MD  Chief Complaint/Reason for Referral: Follow-up coronary artery calcification, diastolic dysfunction ASSESSMENT:    1. Coronary artery disease of native artery of native heart with stable angina pectoris   2. Diastolic dysfunction   3. Primary hypertension   4. Hyperlipidemia LDL goal <70   5. Stage 3a chronic kidney disease (HCC)       PLAN:   In order of problems listed above: Coronary artery disease: Angina is much improved.  She develops an atypical chest pain that lasts seconds.  Monitor for now.  Continue aspirin  81 mg, Plavix  75 mg for 6 months followed by Plavix  monotherapy indefinitely.  Continue as needed nitroglycerin .  Imdur  and metoprolol  were discontinued in the hospital.  Okay for cardiac rehabilitation. Diastolic dysfunction: Continue telmisartan  40 mg daily, start Jardiance  10 mg daily.  Will consider restarting Toprol  later. Hypertension: Continue telmisartan  40 mg daily.  Blood pressure is well-controlled. Hyperlipidemia: Continue atorvastatin  20 mg, LDL was at goal of 66 2 weeks ago.   CKD stage IIIa: Continue telmisartan  40 mg daily and start Jardiance  10 mg daily   Cardiac Rehabilitation Eligibility Assessment             Dispo:  Return in about 6 months (around 06/10/2025).       I spent 35 minutes reviewing all clinical data during and prior to this visit including all relevant imaging studies, laboratories, clinical information from other health systems and prior notes from both Cardiology and other specialties, interviewing the patient, conducting a complete physical examination, and coordinating care in order to formulate a comprehensive and personalized evaluation and treatment plan.   History of Present Illness:    FOCUSED  PROBLEM LIST:   Coronary artery disease  FFR significant mid LAD lesion on coronary CTA December 2025 Status post PCI of mLAD DES x1 January 2026 Diastolic dysfunction G1 DD, no significant valve issues, EF 60 to 65% TTE 2023 Hypertension Hyperlipidemia LP(a) less than 8.4 Aortic atherosclerosis CT abdomen pelvis 2017 CKD stage IIIa Syncope No AF, VT/VF, bradycardia, or ventricular or atrial ectopy monitor December 2025 BMI 06 October 2024:  Patient consents to use of AI scribe. The patient is 79 year old female with above listed medical problems referred to establish cardiovascular care.  She was last seen by the [facility] cardiology group in 2024.  She was doing well and no changes were made to her medical regimen.  She experiences chest pain described as pressure-like, primarily occurring after eating or during intense physical activity. She has a history of acid reflux, complicating the differentiation between cardiac and gastrointestinal pain. The pain resolves spontaneously.  She has had two episodes of syncope, one while reaching up to adjust a thermostat and another after a shower while applying oil. During these episodes, she experienced severe spasms starting from her head and radiating upwards, which she associates with stress. She has been evaluated by a headache specialist but remains uncertain about the cause of these episodes.  No bad bleeding, bruising, or noticeable swelling. No significant issues with breathing.  Plan: Obtain coronary CTA, echocardiogram, monitor, start aspirin  81 mg, atorvastatin  20 mg, check lipid panel, LFTs, LP(a) in 2 months.  Increase telmisartan  to 80 mg  January 2026:  Patient consents to use of AI scribe. In  the interim the patient's coronary CTA suggested a mid LAD lesion.  She was started on Imdur  15 mg at bedtime and Toprol  12.5 mg at bedtime.  Her echocardiogram demonstrated no significant valvular issues with a preserved ejection fraction  and grade 1 diastolic dysfunction.  She is here to discuss further.  She experiences chest discomfort primarily during exertion, such as climbing hills while showing property, occurring approximately two to three times per week. She describes the sensation more as shortness of breath rather than pain, stating 'I don't really have pain, more of shortness of breath.' No chest discomfort at rest.  She has been on Imdur  and Toprol  for three weeks, taking half a pill every month. She is uncertain about the dosage of her blood pressure medication, which was increased from 40 mg to 80 mg due to elevated blood pressure readings. Her blood pressure was noted to be 132/80 previously, and she is currently on telmisartan .  She mentions a long-standing experience of chest heaviness, which she attributes to her low heart rate, stating 'what I'm experiencing is something that I've been dealing with a long time.' This sensation has been present for the last ten to fifteen years.  Plan: Continue medical therapy.  Patient will decide about PCI versus medical therapy.  Check lipid panel, LFTs, LP(a).  February 2026:  Patient consents to use of AI scribe. In the interim her LDL was found to be 66 and her LP(a) was not elevated.  She reached out due to ongoing chest discomfort despite multiple antianginals.  She was referred for coronary angiography and possible PCI.  She underwent PCI of her mid LAD with 1 drug-eluting stent.  Her Imdur  and Toprol  were discontinued.  She is here for follow-up.  She no longer experiences chest or arm pain, although she mentions a sharp pain in her chest that occurs occasionally but resolves quickly. She has not been walking much since the procedure.  She is currently on Plavix  and aspirin . She was previously on Imdur  and Toprol , which have been discontinued. She takes 40 mg of a blood pressure medication, which she breaks in half. Recent blood pressure readings have varied between 117 and  137 mmHg.  She experiences some bruising, which she attributes to her medication regimen. She has a history of acid reflux and is on a baby aspirin  dose of 81 mg along with Protonix  to manage stomach acid.  She has not had recent urinary tract infections, with the last one occurring a long time ago.  Her LDL cholesterol was recently measured at 66 mg/dL.     Current Medications: Current Meds  Medication Sig   acetaminophen  (TYLENOL ) 500 MG tablet Take 500 mg by mouth every 6 (six) hours as needed for mild pain.   aspirin  EC 81 MG tablet Take 1 tablet (81 mg total) by mouth daily. Swallow whole.   atorvastatin  (LIPITOR) 20 MG tablet Take 1 tablet (20 mg total) by mouth daily.   Blood Pressure Monitoring (OMRON 3 SERIES BP MONITOR) DEVI Use to measure blood pressure as directed.   Cholecalciferol  (VITAMIN D3) 125 MCG (5000 UT) CAPS Take 5,000 Units by mouth daily.   clopidogrel  (PLAVIX ) 75 MG tablet Take 1 tablet (75 mg total) by mouth daily.   cyanocobalamin  (VITAMIN B12) 1000 MCG tablet Take 1,000 mcg by mouth daily.   empagliflozin  (JARDIANCE ) 10 MG TABS tablet Take 1 tablet (10 mg total) by mouth daily before breakfast.   escitalopram  (LEXAPRO ) 10 MG tablet TAKE 1 TABLET(10  MG) BY MOUTH DAILY   fluticasone (FLONASE ALLERGY RELIEF) 50 MCG/ACT nasal spray Place 1 spray into both nostrils daily as needed for allergies.   levothyroxine  (SYNTHROID , LEVOTHROID) 50 MCG tablet Take 50 mcg by mouth daily before breakfast.   nitroGLYCERIN  (NITROSTAT ) 0.4 MG SL tablet Place 1 tablet (0.4 mg total) under the tongue every 5 (five) minutes as needed for chest pain (Take only a maximum of 3 tablets per chest pain episode.).   pantoprazole  (PROTONIX ) 40 MG tablet Take 1 tablet (40 mg total) by mouth daily.   telmisartan  (MICARDIS ) 40 MG tablet Take 1 tablet (40 mg total) by mouth daily.     Review of Systems:   Please see the history of present illness.    All other systems reviewed and are  negative.     EKGs/Labs/Other Test Reviewed:   EKG: January 2026 sinus bradycardia with baseline artifact  EKG Interpretation Date/Time:    Ventricular Rate:    PR Interval:    QRS Duration:    QT Interval:    QTC Calculation:   R Axis:      Text Interpretation:          CARDIAC STUDIES: Refer to CV Procedures and Imaging Tabs   Risk Assessment/Calculations:          Physical Exam:   VS:  BP (!) 120/54   Pulse (!) 59   Ht 5' 7 (1.702 m)   Wt 152 lb (68.9 kg)   SpO2 91%   BMI 23.81 kg/m        Wt Readings from Last 3 Encounters:  12/11/24 152 lb (68.9 kg)  12/09/24 147 lb (66.7 kg)  12/05/24 156 lb (70.8 kg)      GENERAL:  No apparent distress, AOx3 HEENT:  No carotid bruits, +2 carotid impulses, no scleral icterus CAR: RRR no murmurs, gallops, rubs, or thrills RES:  Clear to auscultation bilaterally ABD:  Soft, nontender, nondistended, positive bowel sounds x 4 VASC:  +2 radial pulses, +2 carotid pulses NEURO:  CN 2-12 grossly intact; motor and sensory grossly intact PSYCH:  No active depression or anxiety EXT:  Mild bruising R wrist  Signed, Lurena MARLA Red, MD  12/11/2024 11:07 AM    [facility] [address] [place], [place], [place]  [address] Phone: [phone]; Fax: [phone]   Note:  This document was prepared using Dragon voice recognition software and may include unintentional dictation errors. "

## 2024-12-10 ENCOUNTER — Encounter (HOSPITAL_COMMUNITY): Payer: Self-pay | Admitting: Internal Medicine

## 2024-12-10 ENCOUNTER — Telehealth (HOSPITAL_COMMUNITY): Payer: Self-pay

## 2024-12-10 NOTE — Progress Notes (Signed)
" ° °  Nurse Visit   Date of Encounter: 12/05/24 ID: BEAUTIFUL PENSYL, DOB 24-Nov-1945, MRN [id number]  PCP:  [name] LABOR, MD   [facility] Providers Cardiologist:  Lurena MARLA Red, MD Electrophysiologist:  Soyla Gladis Norton, MD      Visit Details   VS:  BP (!) 171/76 (BP Location: Left Arm, Patient Position: Sitting, Cuff Size: Normal)   Pulse (!) 58   Ht 5' 6 (1.676 m)   Wt 156 lb (70.8 kg)   BMI 25.18 kg/m  , BMI Body mass index is 25.18 kg/m.  Wt Readings from Last 3 Encounters:  12/09/24 147 lb (66.7 kg)  12/05/24 156 lb (70.8 kg)  11/25/24 157 lb 3.2 oz (71.3 kg)     Reason for visit: EKG for upcoming PCI Performed today: Vitals, EKG, Provider consulted:Dr. Wonda, and Education Changes (medications, testing, etc.) : None Length of Visit: 10 minutes    Medications Adjustments/Labs and Tests Ordered: Orders Placed This Encounter  Procedures   EKG 12-Lead   No orders of the defined types were placed in this encounter.    Signed, Ronal myrtis Huron, RN  "

## 2024-12-10 NOTE — Telephone Encounter (Signed)
 Pt stated that she will call if she is interested in the cardiac rehab program.   Closed referral.

## 2024-12-11 ENCOUNTER — Encounter: Payer: Self-pay | Admitting: Internal Medicine

## 2024-12-11 ENCOUNTER — Ambulatory Visit: Admitting: Internal Medicine

## 2024-12-11 VITALS — BP 120/54 | HR 59 | Ht 67.0 in | Wt 152.0 lb

## 2024-12-11 DIAGNOSIS — E785 Hyperlipidemia, unspecified: Secondary | ICD-10-CM

## 2024-12-11 DIAGNOSIS — N1831 Chronic kidney disease, stage 3a: Secondary | ICD-10-CM

## 2024-12-11 DIAGNOSIS — I25118 Atherosclerotic heart disease of native coronary artery with other forms of angina pectoris: Secondary | ICD-10-CM

## 2024-12-11 DIAGNOSIS — I10 Essential (primary) hypertension: Secondary | ICD-10-CM

## 2024-12-11 DIAGNOSIS — I5189 Other ill-defined heart diseases: Secondary | ICD-10-CM

## 2024-12-11 MED ORDER — EMPAGLIFLOZIN 10 MG PO TABS: 10.0000 mg | ORAL_TABLET | Freq: Every day | ORAL | 3 refills | Status: AC

## 2024-12-11 NOTE — Patient Instructions (Addendum)
 Medication Instructions:    Start taking Jardiance  10 mg daily   *If you need a refill on your cardiac medications before your next appointment, please call your pharmacy*   Lab Work: Not needed    Testing/Procedures:  Not needed  Follow-Up: At [facility], you and your health needs are our priority.  As part of our continuing mission to provide you with exceptional heart care, we have created designated Provider Care Teams.  These Care Teams include your primary Cardiologist (physician) and Advanced Practice Providers (APPs -  Physician Assistants and Nurse Practitioners) who all work together to provide you with the care you need, when you need it.     Your next appointment:   6 month(s)  The format for your next appointment:   In Person  Provider:   Arun K Thukkani, MD  You have been referred to Cardiac Rehab ll  - they will contact you with orientation  and explanation  of the program

## 2025-01-20 ENCOUNTER — Ambulatory Visit: Admitting: Internal Medicine

## 2025-03-18 ENCOUNTER — Ambulatory Visit: Admitting: Internal Medicine
